# Patient Record
Sex: Female | Born: 1945 | Race: White | Hispanic: No | Smoking: Never smoker
Health system: Southern US, Community
[De-identification: ages and names within clinical notes are randomized; demographics above are authoritative.]

## PROBLEM LIST (undated history)

## (undated) DIAGNOSIS — R519 Headache, unspecified: Secondary | ICD-10-CM

## (undated) DIAGNOSIS — I6529 Occlusion and stenosis of unspecified carotid artery: Secondary | ICD-10-CM

## (undated) DIAGNOSIS — I82409 Acute embolism and thrombosis of unspecified deep veins of unspecified lower extremity: Secondary | ICD-10-CM

## (undated) DIAGNOSIS — M5030 Other cervical disc degeneration, unspecified cervical region: Secondary | ICD-10-CM

## (undated) DIAGNOSIS — N2889 Other specified disorders of kidney and ureter: Secondary | ICD-10-CM

## (undated) DIAGNOSIS — I10 Essential (primary) hypertension: Secondary | ICD-10-CM

## (undated) DIAGNOSIS — R569 Unspecified convulsions: Secondary | ICD-10-CM

## (undated) DIAGNOSIS — R011 Cardiac murmur, unspecified: Secondary | ICD-10-CM

## (undated) DIAGNOSIS — I2699 Other pulmonary embolism without acute cor pulmonale: Secondary | ICD-10-CM

## (undated) DIAGNOSIS — D649 Anemia, unspecified: Secondary | ICD-10-CM

## (undated) DIAGNOSIS — K219 Gastro-esophageal reflux disease without esophagitis: Secondary | ICD-10-CM

## (undated) DIAGNOSIS — I2510 Atherosclerotic heart disease of native coronary artery without angina pectoris: Secondary | ICD-10-CM

## (undated) HISTORY — PX: TUBAL LIGATION: SHX77

## (undated) HISTORY — PX: TONSILLECTOMY: SUR1361

## (undated) HISTORY — PX: SHOULDER SURGERY: SHX246

## (undated) HISTORY — PX: ABDOMINAL HYSTERECTOMY: SHX81

---

## 2004-09-07 ENCOUNTER — Emergency Department: Payer: Self-pay | Admitting: Emergency Medicine

## 2005-12-23 ENCOUNTER — Ambulatory Visit: Payer: Self-pay | Admitting: Psychiatry

## 2008-02-16 ENCOUNTER — Ambulatory Visit: Payer: Self-pay | Admitting: Internal Medicine

## 2008-03-28 ENCOUNTER — Ambulatory Visit: Payer: Self-pay | Admitting: Gastroenterology

## 2009-02-15 ENCOUNTER — Ambulatory Visit: Payer: Self-pay | Admitting: Internal Medicine

## 2009-11-13 ENCOUNTER — Ambulatory Visit: Payer: Self-pay | Admitting: Internal Medicine

## 2010-03-27 ENCOUNTER — Ambulatory Visit: Payer: Self-pay | Admitting: Internal Medicine

## 2010-04-17 ENCOUNTER — Ambulatory Visit: Payer: Self-pay | Admitting: Internal Medicine

## 2010-04-17 ENCOUNTER — Inpatient Hospital Stay: Payer: Self-pay | Admitting: Internal Medicine

## 2010-11-20 ENCOUNTER — Ambulatory Visit: Payer: Self-pay | Admitting: Internal Medicine

## 2011-11-21 ENCOUNTER — Ambulatory Visit: Payer: Self-pay | Admitting: Internal Medicine

## 2011-12-02 ENCOUNTER — Ambulatory Visit: Payer: Self-pay | Admitting: Internal Medicine

## 2011-12-11 ENCOUNTER — Ambulatory Visit: Payer: Self-pay | Admitting: Unknown Physician Specialty

## 2012-05-18 ENCOUNTER — Ambulatory Visit: Payer: Self-pay | Admitting: Internal Medicine

## 2012-09-16 ENCOUNTER — Ambulatory Visit: Payer: Self-pay | Admitting: Neurology

## 2012-09-16 LAB — CREATININE, SERUM
Creatinine: 1.02 mg/dL (ref 0.60–1.30)
EGFR (African American): >60
EGFR (Non-African Amer.): 56 — ABNORMAL LOW

## 2012-12-02 ENCOUNTER — Ambulatory Visit: Payer: Self-pay | Admitting: Internal Medicine

## 2013-06-15 ENCOUNTER — Ambulatory Visit: Payer: Self-pay | Admitting: Obstetrics and Gynecology

## 2013-08-15 DIAGNOSIS — I340 Nonrheumatic mitral (valve) insufficiency: Secondary | ICD-10-CM | POA: Insufficient documentation

## 2013-09-30 ENCOUNTER — Ambulatory Visit: Payer: Self-pay | Admitting: Internal Medicine

## 2013-12-07 DIAGNOSIS — Z7901 Long term (current) use of anticoagulants: Secondary | ICD-10-CM | POA: Insufficient documentation

## 2014-02-08 DIAGNOSIS — I2510 Atherosclerotic heart disease of native coronary artery without angina pectoris: Secondary | ICD-10-CM | POA: Insufficient documentation

## 2014-03-12 ENCOUNTER — Observation Stay: Payer: Self-pay | Admitting: Internal Medicine

## 2014-03-12 LAB — COMPREHENSIVE METABOLIC PANEL
ALT: 21 U/L
ANION GAP: 9 (ref 7–16)
Albumin: 3.2 g/dL — ABNORMAL LOW (ref 3.4–5.0)
Alkaline Phosphatase: 77 U/L
BUN: 13 mg/dL (ref 7–18)
Bilirubin,Total: 0.4 mg/dL (ref 0.2–1.0)
Calcium, Total: 8.6 mg/dL (ref 8.5–10.1)
Chloride: 110 mmol/L — ABNORMAL HIGH (ref 98–107)
Co2: 22 mmol/L (ref 21–32)
Creatinine: 0.78 mg/dL (ref 0.60–1.30)
EGFR (African American): >60
Glucose: 105 mg/dL — ABNORMAL HIGH (ref 65–99)
Osmolality: 282 (ref 275–301)
Potassium: 3.9 mmol/L (ref 3.5–5.1)
SGOT(AST): 19 U/L (ref 15–37)
Sodium: 141 mmol/L (ref 136–145)
TOTAL PROTEIN: 6.6 g/dL (ref 6.4–8.2)

## 2014-03-12 LAB — CBC
HCT: 38.4 % (ref 35.0–47.0)
HGB: 13.1 g/dL (ref 12.0–16.0)
MCH: 31.9 pg (ref 26.0–34.0)
MCHC: 34.1 g/dL (ref 32.0–36.0)
MCV: 94 fL (ref 80–100)
PLATELETS: 340 10*3/uL (ref 150–440)
RBC: 4.11 10*6/uL (ref 3.80–5.20)
RDW: 13.2 % (ref 11.5–14.5)
WBC: 7.2 10*3/uL (ref 3.6–11.0)

## 2014-03-12 LAB — URINALYSIS, COMPLETE
Bilirubin,UR: NEGATIVE
Blood: NEGATIVE
GLUCOSE, UR: NEGATIVE mg/dL (ref 0–75)
Ketone: NEGATIVE
LEUKOCYTE ESTERASE: NEGATIVE
NITRITE: NEGATIVE
Ph: 7 (ref 4.5–8.0)
Protein: NEGATIVE
RBC, UR: NONE SEEN /HPF (ref 0–5)
Specific Gravity: 1.004 (ref 1.003–1.030)
Squamous Epithelial: NONE SEEN

## 2014-03-12 LAB — CK TOTAL AND CKMB (NOT AT ARMC)
CK, TOTAL: 65 U/L (ref 26–192)
CK, Total: 68 U/L (ref 26–192)
CK-MB: 2.1 ng/mL (ref 0.5–3.6)
CK-MB: 1.9 ng/mL (ref 0.5–3.6)

## 2014-03-12 LAB — TROPONIN I
TROPONIN-I: 0.06 ng/mL — AB
Troponin-I: 0.06 ng/mL — ABNORMAL HIGH
Troponin-I: 0.06 ng/mL — ABNORMAL HIGH

## 2014-03-12 LAB — PROTIME-INR
INR: 1.1
PROTHROMBIN TIME: 13.7 s (ref 11.5–14.7)

## 2014-03-13 LAB — CBC WITH DIFFERENTIAL/PLATELET
BASOS ABS: 0.1 10*3/uL (ref 0.0–0.1)
Basophil %: 1.4 %
Eosinophil #: 0.5 10*3/uL (ref 0.0–0.7)
Eosinophil %: 5.5 %
HCT: 43.4 % (ref 35.0–47.0)
HGB: 14.2 g/dL (ref 12.0–16.0)
LYMPHS ABS: 4.5 10*3/uL — AB (ref 1.0–3.6)
Lymphocyte %: 49.6 %
MCH: 30.9 pg (ref 26.0–34.0)
MCHC: 32.7 g/dL (ref 32.0–36.0)
MCV: 94 fL (ref 80–100)
MONO ABS: 0.6 x10 3/mm (ref 0.2–0.9)
Monocyte %: 7.1 %
Neutrophil #: 3.3 10*3/uL (ref 1.4–6.5)
Neutrophil %: 36.4 %
Platelet: 361 10*3/uL (ref 150–440)
RBC: 4.6 10*6/uL (ref 3.80–5.20)
RDW: 13.3 % (ref 11.5–14.5)
WBC: 9.1 10*3/uL (ref 3.6–11.0)

## 2014-03-13 LAB — BASIC METABOLIC PANEL
Anion Gap: 10 (ref 7–16)
BUN: 9 mg/dL (ref 7–18)
CALCIUM: 9 mg/dL (ref 8.5–10.1)
Chloride: 109 mmol/L — ABNORMAL HIGH (ref 98–107)
Co2: 21 mmol/L (ref 21–32)
Creatinine: 0.91 mg/dL (ref 0.60–1.30)
Glucose: 95 mg/dL (ref 65–99)
Osmolality: 278 (ref 275–301)
Potassium: 3.6 mmol/L (ref 3.5–5.1)
Sodium: 140 mmol/L (ref 136–145)

## 2014-03-14 LAB — BASIC METABOLIC PANEL
Anion Gap: 7 (ref 7–16)
BUN: 11 mg/dL (ref 7–18)
CALCIUM: 7.9 mg/dL — AB (ref 8.5–10.1)
CHLORIDE: 112 mmol/L — AB (ref 98–107)
CO2: 21 mmol/L (ref 21–32)
CREATININE: 0.85 mg/dL (ref 0.60–1.30)
EGFR (Non-African Amer.): >60
Glucose: 96 mg/dL (ref 65–99)
Osmolality: 279 (ref 275–301)
POTASSIUM: 3.7 mmol/L (ref 3.5–5.1)
SODIUM: 140 mmol/L (ref 136–145)

## 2014-03-14 LAB — CBC WITH DIFFERENTIAL/PLATELET
BASOS ABS: 0 10*3/uL (ref 0.0–0.1)
Basophil %: 0.2 %
EOS ABS: 0.5 10*3/uL (ref 0.0–0.7)
Eosinophil %: 6 %
HCT: 38.5 % (ref 35.0–47.0)
HGB: 12.6 g/dL (ref 12.0–16.0)
LYMPHS PCT: 14.9 %
Lymphocyte #: 1.2 10*3/uL (ref 1.0–3.6)
MCH: 31 pg (ref 26.0–34.0)
MCHC: 32.6 g/dL (ref 32.0–36.0)
MCV: 95 fL (ref 80–100)
MONO ABS: 0.4 x10 3/mm (ref 0.2–0.9)
Monocyte %: 5.2 %
Neutrophil #: 6.1 10*3/uL (ref 1.4–6.5)
Neutrophil %: 73.7 %
Platelet: 296 10*3/uL (ref 150–440)
RBC: 4.06 10*6/uL (ref 3.80–5.20)
RDW: 12.9 % (ref 11.5–14.5)
WBC: 8.3 10*3/uL (ref 3.6–11.0)

## 2014-05-04 ENCOUNTER — Ambulatory Visit: Payer: Self-pay | Admitting: Internal Medicine

## 2014-07-02 NOTE — H&P (Signed)
PATIENT NAME:  Julia Klein, Julia Klein MR#:  [id number] DATE OF BIRTH:  [birth year]  DATE OF ADMISSION:  03/12/2014  REFERRING PHYSICIAN: Latina Craver, MD   FAMILY PHYSICIAN: Leonie Douglas. Shynice Sigel, MD   REASON FOR ADMISSION: Chest pain, worrisome for unstable angina.   HISTORY OF PRESENT ILLNESS: The patient is a 69 year old female with a history of bilateral PEs, on Xarelto. Also, has a history of seizures, degenerative disk disease. Presents to the Emergency Room with chest pain described as heaviness which occurred while walking her dog this morning. It was associated with nausea, diaphoresis and tachycardic palpitations. No shortness of breath. She presented to the Emergency Room where her EKG was nonacute, but her troponin was mildly elevated at 0.06. She is currently chest pain free. She is now admitted for further evaluation. Of note, the patient is followed by Dr. Serafina Royals of cardiology and had a stress test done approximately 6 months ago which was unremarkable.   PAST MEDICAL HISTORY:  1.  History of bilateral PEs, on Xarelto.  2.  Seizure disorder.  3.  Recurrent bronchitis.  4.  Cervical degenerative disk disease.   MEDICATIONS:  1.  Xarelto 20 mg p.o. daily.  2.  Topamax 50 mg p.o. at bedtime.   ALLERGIES: No known drug allergies.   SOCIAL HISTORY: The patient is a former smoker, but none over the past 5 to 7 years. Denies alcohol abuse.   FAMILY HISTORY: Positive for coronary artery disease, hypertension and diabetes.   REVIEW OF SYSTEMS: CONSTITUTIONAL: No fever or change in weight.  EYES: No blurred or double vision. No glaucoma.  EARS, NOSE AND THROAT: No tinnitus or hearing loss. No nasal discharge or bleeding. No difficulty swallowing.  RESPIRATORY: No cough or wheezing. Denies hemoptysis. No painful respiration.  CARDIOVASCULAR: No orthopnea or syncope.  GASTROINTESTINAL: No vomiting or diarrhea. No abdominal pain. No change in bowel habits.  GENITOURINARY: No  dysuria or hematuria. No incontinence.  ENDOCRINE: No polyuria or polydipsia. No heat or cold intolerance.  HEMATOLOGIC: The patient denies anemia, easy bruising or bleeding.  LYMPHATIC: No swollen glands.  MUSCULOSKELETAL: The patient denies pain in her back, shoulders, knees or hips. No gout.  NEUROLOGIC: No numbness or migraines. Denies stroke.  PSYCHIATRIC: The patient denies anxiety, insomnia or depression.   PHYSICAL EXAMINATION:  GENERAL: The patient is in no acute distress.  VITAL SIGNS: Currently remarkable for a blood pressure of 103/62 with a heart rate of 71, respiratory rate of 18, temperature of 98.3, saturation 99% on room air.  HEENT: Normocephalic, atraumatic. Pupils equally round and reactive to light and accommodation. Extraocular movements are intact. Sclerae are nonicteric. Conjunctivae are clear. Oropharynx is clear. NECK: Supple without JVD. No adenopathy or thyromegaly is noted.  LUNGS: Clear to auscultation and percussion without wheezes, rales or rhonchi. No dullness. Respiratory effort is normal.  CARDIAC: Regular rate and rhythm with a normal S1, S2. No significant rubs, murmurs or gallops. PMI is nondisplaced. Chest wall is nontender.  ABDOMEN: Soft, nontender with normoactive bowel sounds. No organomegaly or masses were appreciated. No hernias or bruits were noted.  EXTREMITIES: Without clubbing, cyanosis or edema. Pulses were 2+ bilaterally.  SKIN: Warm and dry without rash or lesions.  NEUROLOGIC: Revealed cranial nerves II-XII grossly intact. Deep tendon reflexes were symmetric. Motor and sensory examination is nonfocal.  PSYCHIATRIC: Revealed a patient who was alert and oriented to person, place and time. She was cooperative and used good judgment.   LABORATORY DATA: EKG revealed sinus  rhythm with no acute ischemic changes. Her chest x-ray was unremarkable. Her troponin was 0.06. CBC was within normal limits. Glucose was 105 with a BUN of 13 and a creatinine of  0.78 with a GFR of greater than 60.   ASSESSMENT:  1.  Chest pain, worrisome for unstable angina.  2.  Tachycardic palpitations.  3.  Elevated troponin.  4.  History of bilateral pulmonary embolisms.  5.  Seizure disorder.   PLAN: The patient will be observed on telemetry with aspirin and Lovenox. We will hold her Xarelto for now in case cardiology decides on cardiac catheterization. We will follow serial cardiac enzymes and obtain an urgent cardiology consult. Continue her Topamax for now. Follow up routine laboratories in the morning. Further treatment and evaluation will depend upon the patient's progress.   TOTAL TIME SPENT ON THIS PATIENT: Forty-five minutes.    ____________________________ Leonie Douglas Doy Hutching, MD jds:TT D: 03/12/2014 14:20:43 ET T: 03/12/2014 14:44:22 ET JOB#: [id number]  cc: Leonie Douglas. Doy Hutching, MD, <Dictator> [facility] Chaniyah Jahr D Mixtli Reno MD ELECTRONICALLY SIGNED 03/12/2014 18:03

## 2014-07-02 NOTE — Consult Note (Signed)
PATIENT NAME:  Julia Klein, Julia Klein MR#:  [id number] DATE OF BIRTH:  01-01-1946  DATE OF CONSULTATION:  03/12/2014  CONSULTING PHYSICIAN:  Isaias Cowman, MD  PRIMARY CARE PHYSICIAN:  Leonie Douglas. Doy Hutching, MD  CHIEF COMPLAINT: Chest pain.   REASON FOR CONSULTATION: Requested for evaluation of chest pain and borderline elevated troponin.   HISTORY OF PRESENT ILLNESS: The patient is a 69 year old female with history of bilateral pulmonary emboli, on chronic Xarelto therapy. The patient has been in her usual state of health until day of admission, when she experienced exertional chest pain. The patient was apparently walking her dog when she experienced substernal chest heaviness without radiation or  shortness of breath. The patient did experience nausea, diaphoresis, and palpitations. In the Emergency Room, EKG was nondiagnostic. Initial troponin was borderline elevated to 0.06. The patient did not take her Xarelto today.   PAST MEDICAL HISTORY: 1.  Bilateral pulmonary emboli.  2.  Seizure disorder.  3.  Recurrent bronchitis.   MEDICATIONS: Xarelto 20 mg daily, Topamax 50 mg at bedtime.  ALLERGIES:  No known drug allergies.   SOCIAL HISTORY: The patient is married, resides with her husband. She quit tobacco abuse 16 years ago.   FAMILY HISTORY: Father and brother are status post MI.   REVIEW OF SYSTEMS:  CONSTITUTIONAL: No fever or chills.  EYES: No blurry vision.  EARS: No hearing loss.  RESPIRATORY: No shortness of breath.  CARDIOVASCULAR: Chest pain as described above.  GASTROINTESTINAL: The patient had nausea. GENITOURINARY: No dysuria or hematuria.  ENDOCRINE: No polyuria or polydipsia.  MUSCULOSKELETAL: No arthralgias or myalgias.  NEUROLOGICAL: No focal muscle weakness or numbness.  PSYCHOLOGICAL: No depression or anxiety.   PHYSICAL EXAMINATION: VITAL SIGNS: Blood pressure 136/73, pulse 71, respirations 18, temperature 97.5, pulse oximetry 98%.  HEENT: Pupils equal,  reactive to light and accommodation.  NECK: Supple without thyromegaly.  LUNGS: Clear.  CARDIOVASCULAR: Normal JVP. Normal PMI. Regular rate and rhythm. Normal S1, S2. No appreciable gallop, murmur, or rub.  ABDOMEN: Soft and nontender. Pulses were intact bilaterally.  MUSCULOSKELETAL: Normal muscle tone.  NEUROLOGIC: The patient is alert and oriented x 3. Motor and sensory both grossly intact.   IMPRESSION: A 69 year old female with history of pulmonary embolus who presents with exertional chest pain with borderline elevated troponin; EKG is nondiagnostic. The patient has not taken her Xarelto today.   RECOMMENDATIONS: 1.  Agree with overall current therapy.  2.  Hold Xarelto. 3.  Lovenox 1 mg subcutaneous q. 12.  4.  Proceed with cardiac catheterization with selective coronary arteriography in the a.m. The risks, benefits, and alternatives were explained and informed written consent obtained.    ____________________________ Isaias Cowman, MD ap:LT D: 03/12/2014 16:30:39 ET T: 03/12/2014 17:33:33 ET JOB#: [id number]  cc: Isaias Cowman, MD, <Dictator> Isaias Cowman MD ELECTRONICALLY SIGNED 03/22/2014 18:06

## 2015-05-09 ENCOUNTER — Ambulatory Visit
Admission: RE | Admit: 2015-05-09 | Discharge: 2015-05-09 | Disposition: A | Discharge status: Home / Self Care | Payer: Medicare Other | Source: Ambulatory Visit | Attending: Physician Assistant | Admitting: Physician Assistant

## 2015-05-09 ENCOUNTER — Other Ambulatory Visit: Payer: Self-pay | Admitting: Physician Assistant

## 2015-05-09 DIAGNOSIS — M7989 Other specified soft tissue disorders: Secondary | ICD-10-CM | POA: Insufficient documentation

## 2015-07-11 ENCOUNTER — Other Ambulatory Visit: Payer: Self-pay | Admitting: Physician Assistant

## 2015-07-11 DIAGNOSIS — R110 Nausea: Secondary | ICD-10-CM

## 2015-07-11 DIAGNOSIS — R5381 Other malaise: Secondary | ICD-10-CM

## 2015-07-11 DIAGNOSIS — R1011 Right upper quadrant pain: Secondary | ICD-10-CM

## 2015-07-12 ENCOUNTER — Ambulatory Visit
Admission: RE | Admit: 2015-07-12 | Discharge: 2015-07-12 | Disposition: A | Discharge status: Home / Self Care | Payer: Medicare Other | Source: Ambulatory Visit | Attending: Physician Assistant | Admitting: Physician Assistant

## 2015-07-12 DIAGNOSIS — R110 Nausea: Secondary | ICD-10-CM | POA: Diagnosis not present

## 2015-07-12 DIAGNOSIS — R1011 Right upper quadrant pain: Secondary | ICD-10-CM | POA: Diagnosis not present

## 2015-07-12 DIAGNOSIS — R5381 Other malaise: Secondary | ICD-10-CM

## 2015-12-10 DIAGNOSIS — E669 Obesity, unspecified: Secondary | ICD-10-CM | POA: Insufficient documentation

## 2016-04-21 ENCOUNTER — Other Ambulatory Visit: Payer: Self-pay | Admitting: Internal Medicine

## 2016-04-21 DIAGNOSIS — R1084 Generalized abdominal pain: Secondary | ICD-10-CM

## 2016-04-28 ENCOUNTER — Ambulatory Visit
Admission: RE | Admit: 2016-04-28 | Discharge: 2016-04-28 | Disposition: A | Discharge status: Home / Self Care | Payer: Medicare Other | Source: Ambulatory Visit | Attending: Internal Medicine | Admitting: Internal Medicine

## 2016-04-28 DIAGNOSIS — K769 Liver disease, unspecified: Secondary | ICD-10-CM | POA: Diagnosis not present

## 2016-04-28 DIAGNOSIS — R1084 Generalized abdominal pain: Secondary | ICD-10-CM | POA: Insufficient documentation

## 2016-07-14 ENCOUNTER — Other Ambulatory Visit: Payer: Self-pay | Admitting: Nurse Practitioner

## 2016-07-14 DIAGNOSIS — R1011 Right upper quadrant pain: Secondary | ICD-10-CM

## 2016-07-14 DIAGNOSIS — K219 Gastro-esophageal reflux disease without esophagitis: Secondary | ICD-10-CM | POA: Insufficient documentation

## 2016-07-23 ENCOUNTER — Other Ambulatory Visit: Payer: Self-pay | Admitting: Nurse Practitioner

## 2016-07-23 DIAGNOSIS — K219 Gastro-esophageal reflux disease without esophagitis: Secondary | ICD-10-CM

## 2016-07-24 ENCOUNTER — Encounter
Admission: RE | Admit: 2016-07-24 | Discharge: 2016-07-24 | Disposition: A | Discharge status: Home / Self Care | Payer: Medicare Other | Source: Ambulatory Visit | Attending: Nurse Practitioner | Admitting: Nurse Practitioner

## 2016-07-24 DIAGNOSIS — R1011 Right upper quadrant pain: Secondary | ICD-10-CM | POA: Diagnosis present

## 2016-07-24 MED ORDER — TECHNETIUM TC 99M MEBROFENIN IV KIT
4.9700 | PACK | Freq: Once | INTRAVENOUS | Status: AC | PRN
  Administered 2016-07-24: 4.97 via INTRAVENOUS

## 2016-08-01 ENCOUNTER — Ambulatory Visit
Admission: RE | Admit: 2016-08-01 | Discharge: 2016-08-01 | Disposition: A | Discharge status: Home / Self Care | Payer: Medicare Other | Source: Ambulatory Visit | Attending: Nurse Practitioner | Admitting: Nurse Practitioner

## 2016-08-01 ENCOUNTER — Other Ambulatory Visit: Payer: Medicare Other

## 2016-09-02 ENCOUNTER — Other Ambulatory Visit: Payer: Self-pay | Admitting: Internal Medicine

## 2016-09-02 DIAGNOSIS — Z1231 Encounter for screening mammogram for malignant neoplasm of breast: Secondary | ICD-10-CM

## 2016-09-12 ENCOUNTER — Ambulatory Visit
Admission: RE | Admit: 2016-09-12 | Discharge: 2016-09-12 | Disposition: A | Discharge status: Home / Self Care | Payer: Medicare Other | Source: Ambulatory Visit | Attending: Internal Medicine | Admitting: Internal Medicine

## 2016-09-12 ENCOUNTER — Encounter (HOSPITAL_COMMUNITY): Payer: Self-pay

## 2016-09-12 DIAGNOSIS — Z1231 Encounter for screening mammogram for malignant neoplasm of breast: Secondary | ICD-10-CM | POA: Diagnosis present

## 2016-10-20 ENCOUNTER — Other Ambulatory Visit: Payer: Self-pay | Admitting: Nurse Practitioner

## 2016-10-20 DIAGNOSIS — R1011 Right upper quadrant pain: Secondary | ICD-10-CM

## 2016-10-31 ENCOUNTER — Ambulatory Visit: Admit: 2016-10-31 | Payer: Medicare Other | Admitting: Unknown Physician Specialty

## 2016-10-31 SURGERY — COLONOSCOPY WITH PROPOFOL
Anesthesia: General

## 2016-11-04 ENCOUNTER — Ambulatory Visit: Admission: RE | Admit: 2016-11-04 | Payer: Medicare Other | Source: Ambulatory Visit

## 2016-11-04 ENCOUNTER — Ambulatory Visit
Admission: RE | Admit: 2016-11-04 | Discharge: 2016-11-04 | Disposition: A | Discharge status: Home / Self Care | Payer: Medicare Other | Source: Ambulatory Visit | Attending: Nurse Practitioner | Admitting: Nurse Practitioner

## 2016-11-04 DIAGNOSIS — K219 Gastro-esophageal reflux disease without esophagitis: Secondary | ICD-10-CM

## 2016-11-04 DIAGNOSIS — K449 Diaphragmatic hernia without obstruction or gangrene: Secondary | ICD-10-CM | POA: Insufficient documentation

## 2016-11-06 ENCOUNTER — Ambulatory Visit
Admission: RE | Admit: 2016-11-06 | Discharge: 2016-11-06 | Disposition: A | Discharge status: Home / Self Care | Payer: Medicare Other | Source: Ambulatory Visit | Attending: Nurse Practitioner | Admitting: Nurse Practitioner

## 2016-11-06 DIAGNOSIS — R1011 Right upper quadrant pain: Secondary | ICD-10-CM | POA: Diagnosis present

## 2016-11-06 DIAGNOSIS — D1771 Benign lipomatous neoplasm of kidney: Secondary | ICD-10-CM | POA: Insufficient documentation

## 2016-11-06 MED ORDER — GADOBENATE DIMEGLUMINE 529 MG/ML IV SOLN
20.0000 mL | Freq: Once | INTRAVENOUS | Status: AC | PRN
  Administered 2016-11-06: 20 mL via INTRAVENOUS

## 2016-11-08 LAB — POCT I-STAT CREATININE: Creatinine, Ser: 0.8 mg/dL (ref 0.44–1.00)

## 2016-12-01 ENCOUNTER — Other Ambulatory Visit: Payer: Self-pay | Admitting: Internal Medicine

## 2016-12-01 DIAGNOSIS — M7989 Other specified soft tissue disorders: Secondary | ICD-10-CM

## 2016-12-08 ENCOUNTER — Ambulatory Visit: Payer: Medicare Other

## 2017-03-06 ENCOUNTER — Other Ambulatory Visit: Payer: Self-pay

## 2017-03-06 ENCOUNTER — Emergency Department: Payer: Medicare Other

## 2017-03-06 ENCOUNTER — Emergency Department
Admission: EM | Admit: 2017-03-06 | Discharge: 2017-03-06 | Disposition: A | Discharge status: Home / Self Care | Payer: Medicare Other | Attending: Student in an Organized Health Care Education/Training Program | Admitting: Student in an Organized Health Care Education/Training Program

## 2017-03-06 ENCOUNTER — Encounter: Payer: Self-pay | Admitting: Emergency Medicine

## 2017-03-06 DIAGNOSIS — Y9289 Other specified places as the place of occurrence of the external cause: Secondary | ICD-10-CM | POA: Insufficient documentation

## 2017-03-06 DIAGNOSIS — W19XXXA Unspecified fall, initial encounter: Secondary | ICD-10-CM

## 2017-03-06 DIAGNOSIS — W01198A Fall on same level from slipping, tripping and stumbling with subsequent striking against other object, initial encounter: Secondary | ICD-10-CM | POA: Diagnosis not present

## 2017-03-06 DIAGNOSIS — S20229A Contusion of unspecified back wall of thorax, initial encounter: Secondary | ICD-10-CM | POA: Insufficient documentation

## 2017-03-06 DIAGNOSIS — G44319 Acute post-traumatic headache, not intractable: Secondary | ICD-10-CM | POA: Diagnosis not present

## 2017-03-06 DIAGNOSIS — Y9389 Activity, other specified: Secondary | ICD-10-CM | POA: Diagnosis not present

## 2017-03-06 DIAGNOSIS — G40A09 Absence epileptic syndrome, not intractable, without status epilepticus: Secondary | ICD-10-CM | POA: Insufficient documentation

## 2017-03-06 DIAGNOSIS — S3992XA Unspecified injury of lower back, initial encounter: Secondary | ICD-10-CM | POA: Diagnosis present

## 2017-03-06 DIAGNOSIS — M62830 Muscle spasm of back: Secondary | ICD-10-CM | POA: Diagnosis not present

## 2017-03-06 DIAGNOSIS — Y998 Other external cause status: Secondary | ICD-10-CM | POA: Diagnosis not present

## 2017-03-06 HISTORY — DX: Other pulmonary embolism without acute cor pulmonale: I26.99

## 2017-03-06 HISTORY — DX: Unspecified convulsions: R56.9

## 2017-03-06 MED ORDER — METHOCARBAMOL 500 MG PO TABS: 500.0000 mg | ORAL_TABLET | Freq: Four times a day (QID) | ORAL | 0 refills | Status: DC

## 2017-03-06 MED ORDER — HYDROCODONE-ACETAMINOPHEN 5-325 MG PO TABS: 1.0000 | ORAL_TABLET | ORAL | 0 refills | Status: DC | PRN

## 2017-03-06 MED ORDER — ACETAMINOPHEN 325 MG PO TABS
650.0000 mg | ORAL_TABLET | Freq: Once | ORAL | Status: AC
  Administered 2017-03-06: 650 mg via ORAL
  Filled 2017-03-06: qty 2

## 2017-03-06 NOTE — ED Provider Notes (Signed)
[facility] Emergency Department Provider Note  ____________________________________________  Time seen: Approximately 5:25 PM  I have reviewed the triage vital signs and the nursing notes.   HISTORY  Chief Complaint Fall    HPI Julia Klein is a 72 y.o. female who presents emergency department status post fall today.  Patient reports that she was in an auto dealership picking up her car when she stepped on a patch of what well.  Patient reports that her "feet flew out from underneath her" and she landed on her "tailbone/small of the back" region.  Patient reports that initially she had some mild pain but was able to gather herself, walk around with no difficulty.  Patient reports that on the way home she started to develop mid and lower back pain.  After arriving at home, she noticed that her neck was hurting and she developed a headache.  Patient did not hit her head or lose consciousness during the fall.  She denies any lacerations or abrasions.  Patient does not take any medication for this complaint prior to arrival.  Patient has a history of absence seizure.  Patient reports that while in the emergency department, she felt a "seizure coming on."  Patient reports that these typically last for only a period of seconds.  Patient reports that she has had only 1 of these today.  Last seizure activity was 2-3 months ago.  Patient takes Topamax daily for this condition.  Patient denies any change from baseline absence seizure.  Past Medical History:  Diagnosis Date  . Pulmonary embolism ([place])   . Seizures ([place])     There are no active problems to display for this patient.   Past Surgical History:  Procedure Laterality Date  . ABDOMINAL HYSTERECTOMY    . SHOULDER SURGERY Left     Prior to Admission medications   Medication Sig Start Date End Date Taking? Authorizing Provider  HYDROcodone-acetaminophen (NORCO/VICODIN) 5-325 MG tablet Take 1 tablet by mouth  every 4 (four) hours as needed for moderate pain. 03/06/17   Caia Lofaro, Charline Bills, PA-C  methocarbamol (ROBAXIN) 500 MG tablet Take 1 tablet (500 mg total) by mouth 4 (four) times daily. 03/06/17   Masson Nalepa, Charline Bills, PA-C    Allergies Ivp dye [iodinated diagnostic agents]  Family History  Problem Relation Age of Onset  . Breast cancer Paternal Aunt     Social History Social History   Tobacco Use  . Smoking status: Never Smoker  . Smokeless tobacco: Never Used  Substance Use Topics  . Alcohol use: No    Frequency: Never  . Drug use: No     Review of Systems  Constitutional: No fever/chills Eyes: No visual changes.  Cardiovascular: no chest pain. Respiratory: no cough. No SOB. Gastrointestinal: No abdominal pain.  No nausea, no vomiting.  Musculoskeletal: Positive for neck, mid and lower back pain. Skin: Negative for rash, abrasions, lacerations, ecchymosis. Neurological: Acid for headache but denies focal weakness or numbness.  Positive for absence seizure. 10-point ROS otherwise negative.  ____________________________________________   PHYSICAL EXAM:  VITAL SIGNS: ED Triage Vitals  Enc Vitals Group     BP 03/06/17 1656 (!) 148/76     Pulse Rate 03/06/17 1656 81     Resp 03/06/17 1656 14     Temp 03/06/17 1656 98.3 F (36.8 C)     Temp Source 03/06/17 1656 Oral     SpO2 03/06/17 1656 100 %     Weight 03/06/17 1656 220 lb (99.8  kg)     Height 03/06/17 1656 5\' 7"  (1.702 m)     Head Circumference --      Peak Flow --      Pain Score 03/06/17 1700 5     Pain Loc --      Pain Edu? --      Excl. in [place]? --      Constitutional: Alert and oriented. Well appearing and in no acute distress. Eyes: Conjunctivae are normal. PERRL. EOMI. Head: Atraumatic. ENT:      Ears:       Nose: No congestion/rhinnorhea.      Mouth/Throat: Mucous membranes are moist.  Neck: No stridor.  Diffuse cervical spine tenderness to palpation both midline and over the paraspinal muscle  groups.  No palpable abnormality or step-off.  Radial pulse intact bilateral upper extremities.  Sensation intact and equal bilateral upper extremities.  Cardiovascular: Normal rate, regular rhythm. Normal S1 and S2.  Good peripheral circulation. Respiratory: Normal respiratory effort without tachypnea or retractions. Lungs CTAB. Good air entry to the bases with no decreased or absent breath sounds. Musculoskeletal: Full range of motion to all extremities. No gross deformities appreciated.  No visible deformity or injury to the second lumbar spine.  No ecchymosis, abrasions, lacerations.  Patient is diffusely tender to palpation in the upper thoracic and mid to lower lumbar spine.  No palpable abnormality or step-off.  Diffuse tenderness to palpation over bilateral paraspinal muscle groups in both regions.  No tenderness to palpation over bilateral sciatic notches.  Negative straight leg raise bilaterally.  Dorsalis pedis pulse intact bilateral lower extremities.  Sensation intact and equal bilateral lower extremities. Neurologic:  Normal speech and language. No gross focal neurologic deficits are appreciated.  Cranial nerves II through XII grossly intact.  Negative Romberg's and pronator drift. Skin:  Skin is warm, dry and intact. No rash noted. Psychiatric: Mood and affect are normal. Speech and behavior are normal. Patient exhibits appropriate insight and judgement.   ____________________________________________   LABS (all labs ordered are listed, but only abnormal results are displayed)  Labs Reviewed - No data to display ____________________________________________  EKG   ____________________________________________  RADIOLOGY Diamantina Providence Jeanpierre Thebeau, personally viewed and evaluated these images as part of my medical decision making, as well as reviewing the written report by the radiologist.  Dg Thoracic Spine 2 View  Result Date: 03/06/2017 CLINICAL DATA:  Back pain after fall  today. EXAM: THORACIC SPINE 2 VIEWS COMPARISON:  Lateral chest radiograph from 04/23/2010 FINDINGS: Multilevel mild degenerative disc space narrowing and endplate spurring is re- demonstrated with mild anterior compression of T8, stable dating back to 2012. Alignment is normal. No other significant bone abnormalities are identified. IMPRESSION: No acute osseous abnormality of the thoracic spine. Thoracic spondylosis with mild anterior height loss of T8, stable dating back to 2012. Electronically Signed   By: Ashley Royalty M.D.   On: 03/06/2017 18:28   Dg Lumbar Spine Complete  Result Date: 03/06/2017 CLINICAL DATA:  Back pain after fall today. EXAM: LUMBAR SPINE - COMPLETE 4+ VIEW COMPARISON:  None. FINDINGS: Normal lumbar segmentation. Moderate disc space narrowing with endplate spurring is noted at L1-2. No pars defects or listhesis. Mild facet joint space narrowing and sclerosis from L4 through S1. Aortoiliac atherosclerosis without aneurysm. The included SI joints are unremarkable. IMPRESSION: 1. Moderate degenerative disc space narrowing with endplate spurring at O1-6. 2. L4 through S1 facet arthropathy. 3. No acute osseous abnormality of the lumbar spine. Electronically Signed  By: Ashley Royalty M.D.   On: 03/06/2017 18:30   Dg Pelvis 1-2 Views  Result Date: 03/06/2017 CLINICAL DATA:  Left hip pain after fall at car dealership today. EXAM: PELVIS - 1-2 VIEW COMPARISON:  None. FINDINGS: AP view the pelvis only which includes AP views of both hips. Lower lumbar facet arthropathy is seen. Both hip joints are maintained. No flattening of the femoral heads. No acute nor displaced appearing fracture of the bony pelvis and hips. No pelvic bone lesions are seen. IMPRESSION: Lower lumbar facet arthropathy. No apparent fracture of the bony pelvis and hips. Electronically Signed   By: Ashley Royalty M.D.   On: 03/06/2017 18:34   Ct Head Wo Contrast  Result Date: 03/06/2017 CLINICAL DATA:  [place] today, absence seizure  after getting to the emergency room, worsening neck pain since fall, history of absence seizures EXAM: CT HEAD WITHOUT CONTRAST CT CERVICAL SPINE WITHOUT CONTRAST TECHNIQUE: Multidetector CT imaging of the head and cervical spine was performed following the standard protocol without intravenous contrast. Multiplanar CT image reconstructions of the cervical spine were also generated. COMPARISON:  MR brain 09/16/2012, MR cervical spine 02/16/2008 FINDINGS: CT HEAD FINDINGS Brain: Generalized atrophy. Normal ventricular morphology. No midline shift or mass effect. Minimal small vessel chronic ischemic changes of deep cerebral white matter. No intracranial hemorrhage, mass lesion, or evidence of acute infarction. No extra-axial fluid collections. Vascular: Atherosclerotic calcification of internal carotid arteries bilaterally at skullbase Skull: Osseous demineralization.  Calvaria intact. Sinuses/Orbits: Visualized paranasal sinuses and mastoid air cells clear Other: N/A CT CERVICAL SPINE FINDINGS Alignment: Normal Skull base and vertebrae: Osseous demineralization. Visualized skullbase intact. Multilevel disc space narrowing. Multilevel facet degenerative changes. Vertebral body heights maintained without fracture or subluxation. Soft tissues and spinal canal: Prevertebral soft tissues normal thickness. Cervical soft tissues otherwise unremarkable. Disc levels: Scattered endplate spur formation. No gross disc herniation. Upper chest: Tips of lung apices clear. Other: Atherosclerotic calcifications at BILATERAL carotid bifurcations. IMPRESSION: Atrophy with minimal small vessel chronic ischemic changes of deep cerebral white matter. No acute intracranial abnormalities. Osseous demineralization with scattered degenerative disc and facet disease changes of the cervical spine. No acute cervical spine abnormalities. Carotid vascular calcifications bilaterally. Electronically Signed   By: Lavonia Dana M.D.   On: 03/06/2017  18:13   Ct Cervical Spine Wo Contrast  Result Date: 03/06/2017 CLINICAL DATA:  [place] today, absence seizure after getting to the emergency room, worsening neck pain since fall, history of absence seizures EXAM: CT HEAD WITHOUT CONTRAST CT CERVICAL SPINE WITHOUT CONTRAST TECHNIQUE: Multidetector CT imaging of the head and cervical spine was performed following the standard protocol without intravenous contrast. Multiplanar CT image reconstructions of the cervical spine were also generated. COMPARISON:  MR brain 09/16/2012, MR cervical spine 02/16/2008 FINDINGS: CT HEAD FINDINGS Brain: Generalized atrophy. Normal ventricular morphology. No midline shift or mass effect. Minimal small vessel chronic ischemic changes of deep cerebral white matter. No intracranial hemorrhage, mass lesion, or evidence of acute infarction. No extra-axial fluid collections. Vascular: Atherosclerotic calcification of internal carotid arteries bilaterally at skullbase Skull: Osseous demineralization.  Calvaria intact. Sinuses/Orbits: Visualized paranasal sinuses and mastoid air cells clear Other: N/A CT CERVICAL SPINE FINDINGS Alignment: Normal Skull base and vertebrae: Osseous demineralization. Visualized skullbase intact. Multilevel disc space narrowing. Multilevel facet degenerative changes. Vertebral body heights maintained without fracture or subluxation. Soft tissues and spinal canal: Prevertebral soft tissues normal thickness. Cervical soft tissues otherwise unremarkable. Disc levels: Scattered endplate spur formation. No gross disc herniation. Upper chest:  Tips of lung apices clear. Other: Atherosclerotic calcifications at BILATERAL carotid bifurcations. IMPRESSION: Atrophy with minimal small vessel chronic ischemic changes of deep cerebral white matter. No acute intracranial abnormalities. Osseous demineralization with scattered degenerative disc and facet disease changes of the cervical spine. No acute cervical spine abnormalities.  Carotid vascular calcifications bilaterally. Electronically Signed   By: Lavonia Dana M.D.   On: 03/06/2017 18:13    ____________________________________________    PROCEDURES  Procedure(s) performed:    Procedures    Medications  acetaminophen (TYLENOL) tablet 650 mg (650 mg Oral Given 03/06/17 1820)     ____________________________________________   INITIAL IMPRESSION / ASSESSMENT AND PLAN / ED COURSE  Pertinent labs & imaging results that were available during my care of the patient were reviewed by me and considered in my medical decision making (see chart for details).  Review of the [place] CSRS was performed in accordance of the [place] prior to dispensing any controlled drugs.    ----------------------------------------- 6:41 PM on 03/06/2017 -----------------------------------------  Patient's imaging results returned with reassuring results with no osseous or intracranial abnormality.  Patient's exam is reassuring.  Patient does report absence seizure after fall.  Patient is not postictal at this time.  Neurologically intact.  Patient reports that she takes daily medications with good control.  Patient did not strike her head or lose consciousness.  At this time, negative head CT, patient's exam being reassuring, no indication for further workup.    Patient's diagnosis is consistent with fall resulting in back contusion, absence seizure, headache.  Patient's imaging returned with reassuring results.  Patient's exam is very reassuring.  Discussed at length with patient her absence seizure.  Patient reports that she has been under treatment as well as stress, and the increased pain, anxiety over possible diagnosis.  At this time, patient is at her baseline.  I feel comfortable sending the patient home at this time.  I spent considerable amount of time discussing follow-up with patient including signs and symptoms to be concerned over for the next several days.  Patient verbalizes  understanding and states that she will return if symptoms worsen, or she experiences further seizure-like activity.. Patient will be discharged home with prescriptions for a few pills of Vicodin and muscle relaxer. Patient is to follow up with primary care as needed or otherwise directed. Patient is given ED precautions to return to the ED for any worsening or new symptoms.     ____________________________________________  FINAL CLINICAL IMPRESSION(S) / ED DIAGNOSES  Final diagnoses:  Fall, initial encounter  Contusion of back, unspecified laterality, initial encounter  Muscle spasm of back  Acute post-traumatic headache, not intractable  Absence seizure ([place])      NEW MEDICATIONS STARTED DURING THIS VISIT:  ED Discharge Orders        Ordered    HYDROcodone-acetaminophen (NORCO/VICODIN) 5-325 MG tablet  Every 4 hours PRN     03/06/17 1857    methocarbamol (ROBAXIN) 500 MG tablet  4 times daily     03/06/17 1857          This chart was dictated using voice recognition software/Dragon. Despite best efforts to proofread, errors can occur which can change the meaning. Any change was purely unintentional.    Darletta Moll, PA-C 03/06/17 1858    Merlyn Lot, MD 03/06/17 1919

## 2017-03-06 NOTE — ED Triage Notes (Signed)
Pt to ED after fall tonight.  States was at [employer] and slipped on water and oil spot, denies hitting head or LOC.  States feet came out from under her and landed on her buttocks.  C/o headache and pain with palpation down spine.

## 2017-03-06 NOTE — ED Notes (Signed)
Pt slip and fall onto floor at car [employer]. Pt states she landed on her tailbone. Tenderness to palpation of back per PA exam witnessed. Pt alert and oriented X4, active, cooperative, pt in NAD. RR even and unlabored, color WNL.

## 2017-03-06 NOTE — ED Notes (Signed)
No protocols at this time per [place], [place].

## 2017-05-12 ENCOUNTER — Other Ambulatory Visit: Payer: Self-pay | Admitting: Internal Medicine

## 2017-05-12 DIAGNOSIS — M79604 Pain in right leg: Secondary | ICD-10-CM

## 2017-05-18 ENCOUNTER — Ambulatory Visit: Admission: RE | Admit: 2017-05-18 | Payer: Medicare Other | Source: Ambulatory Visit

## 2017-05-19 ENCOUNTER — Ambulatory Visit
Admission: RE | Admit: 2017-05-19 | Discharge: 2017-05-19 | Disposition: A | Discharge status: Home / Self Care | Payer: Medicare Other | Source: Ambulatory Visit | Attending: Internal Medicine | Admitting: Internal Medicine

## 2017-05-19 ENCOUNTER — Other Ambulatory Visit: Payer: Self-pay | Admitting: General Surgery

## 2017-05-19 DIAGNOSIS — N644 Mastodynia: Secondary | ICD-10-CM

## 2017-05-19 DIAGNOSIS — M79604 Pain in right leg: Secondary | ICD-10-CM | POA: Diagnosis present

## 2017-05-26 ENCOUNTER — Ambulatory Visit
Admission: RE | Admit: 2017-05-26 | Discharge: 2017-05-26 | Disposition: A | Discharge status: Home / Self Care | Payer: Medicare Other | Source: Ambulatory Visit | Attending: General Surgery | Admitting: General Surgery

## 2017-05-26 DIAGNOSIS — N644 Mastodynia: Secondary | ICD-10-CM

## 2017-10-14 DIAGNOSIS — T148XXA Other injury of unspecified body region, initial encounter: Secondary | ICD-10-CM | POA: Insufficient documentation

## 2017-11-06 ENCOUNTER — Other Ambulatory Visit: Payer: Self-pay | Admitting: Internal Medicine

## 2017-11-06 DIAGNOSIS — Z1231 Encounter for screening mammogram for malignant neoplasm of breast: Secondary | ICD-10-CM

## 2017-12-02 ENCOUNTER — Ambulatory Visit
Admission: RE | Admit: 2017-12-02 | Discharge: 2017-12-02 | Disposition: A | Discharge status: Home / Self Care | Payer: Medicare Other | Source: Ambulatory Visit | Attending: Internal Medicine | Admitting: Internal Medicine

## 2017-12-02 DIAGNOSIS — Z1231 Encounter for screening mammogram for malignant neoplasm of breast: Secondary | ICD-10-CM | POA: Diagnosis present

## 2018-03-05 DIAGNOSIS — D649 Anemia, unspecified: Secondary | ICD-10-CM | POA: Insufficient documentation

## 2018-03-05 NOTE — Progress Notes (Signed)
[place]  Telephone:(336) [phone] Fax:(336) [phone]  ID: Julia Klein OB: 09-08-1945  MR#: [id number]  KLK#:[id number]  Patient Care Team: Idelle Crouch, MD as PCP - General (Internal Medicine)  CHIEF COMPLAINT: Anemia, unspecified.  INTERVAL HISTORY: Patient is a 73 year old female who was noted to have a declining hemoglobin on routine blood work.  She admits to being tired and fatigued all the time, but otherwise feels well.  She has no neurologic complaints.  She denies any recent fevers or illnesses.  She has a good appetite and denies weight loss.  She has no chest pain or shortness of breath.  She denies any nausea, vomiting, constipation, or diarrhea.  She has no melena or hematochezia.  She has no urinary complaints.  Patient otherwise feels well and offers no further specific complaints today.  REVIEW OF SYSTEMS:   Review of Systems  Constitutional: Positive for malaise/fatigue. Negative for fever and weight loss.  Respiratory: Negative.  Negative for cough, hemoptysis and shortness of breath.   Cardiovascular: Negative.  Negative for chest pain and leg swelling.  Gastrointestinal: Negative.  Negative for abdominal pain, blood in stool and melena.  Genitourinary: Negative.  Negative for hematuria.  Musculoskeletal: Negative.  Negative for back pain.  Skin: Negative.  Negative for rash.  Neurological: Negative.  Negative for dizziness, focal weakness, weakness and headaches.  Psychiatric/Behavioral: Negative.  The patient is not nervous/anxious.     As per HPI. Otherwise, a complete review of systems is negative.  PAST MEDICAL HISTORY: Past Medical History:  Diagnosis Date  . Pulmonary embolism ([place])   . Seizures ([place])     PAST SURGICAL HISTORY: Past Surgical History:  Procedure Laterality Date  . ABDOMINAL HYSTERECTOMY    . SHOULDER SURGERY Left     FAMILY HISTORY: Family History  Problem Relation Age of Onset  . Breast cancer  Paternal Aunt     ADVANCED DIRECTIVES (Y/N):  N  HEALTH MAINTENANCE: Social History   Tobacco Use  . Smoking status: Never Smoker  . Smokeless tobacco: Never Used  Substance Use Topics  . Alcohol use: No    Frequency: Never  . Drug use: No     Colonoscopy:  PAP:  Bone density:  Lipid panel:  Allergies  Allergen Reactions  . Amoxicillin-Pot Clavulanate Diarrhea and Nausea And Vomiting  . Esomeprazole Magnesium Other (See Comments)  . Ivp Dye [Iodinated Diagnostic Agents] Hives  . Levofloxacin Rash and Swelling  . Omeprazole Other (See Comments) and Palpitations    Current Outpatient Medications  Medication Sig Dispense Refill  . rivaroxaban (XARELTO) 10 MG TABS tablet Take 1 tablet by mouth.    . topiramate (TOPAMAX) 50 MG tablet Take 1 tablet by mouth 1 day or 1 dose.    Marland Kitchen HYDROcodone-acetaminophen (NORCO/VICODIN) 5-325 MG tablet Take 1 tablet by mouth every 4 (four) hours as needed for moderate pain. 8 tablet 0   No current facility-administered medications for this visit.     OBJECTIVE: Vitals:   03/11/18 1358  BP: (!) 144/79  Pulse: 95  Temp: (!) 96.7 F (35.9 C)     Body mass index is 36.02 kg/m.    ECOG FS:0 - Asymptomatic  General: Well-developed, well-nourished, no acute distress. Eyes: Pink conjunctiva, anicteric sclera. HEENT: Normocephalic, moist mucous membranes, clear oropharnyx. Lungs: Clear to auscultation bilaterally. Heart: Regular rate and rhythm. No rubs, murmurs, or gallops. Abdomen: Soft, nontender, nondistended. No organomegaly noted, normoactive bowel sounds. Musculoskeletal: No edema, cyanosis, or clubbing. Neuro: Alert, answering  all questions appropriately. Cranial nerves grossly intact. Skin: No rashes or petechiae noted. Psych: Normal affect. Lymphatics: No cervical, calvicular, axillary or inguinal LAD.   LAB RESULTS:  Lab Results  Component Value Date   NA 140 03/14/2014   K 3.7 03/14/2014   CL 112 (H) 03/14/2014   CO2  21 03/14/2014   GLUCOSE 96 03/14/2014   BUN 11 03/14/2014   CREATININE 0.80 11/06/2016   CALCIUM 7.9 (L) 03/14/2014   PROT 6.6 03/12/2014   ALBUMIN 3.2 (L) 03/12/2014   AST 19 03/12/2014   ALT 21 03/12/2014   ALKPHOS 77 03/12/2014   BILITOT 0.4 03/12/2014   GFRNONAA >60 03/14/2014   GFRAA >60 03/14/2014    Lab Results  Component Value Date   WBC 8.1 03/11/2018   NEUTROABS 6.1 03/14/2014   HGB 9.5 (L) 03/11/2018   HCT 30.6 (L) 03/11/2018   MCV 78.3 (L) 03/11/2018   PLT 483 (H) 03/11/2018   Lab Results  Component Value Date   IRON 13 (L) 03/11/2018   TIBC 466 (H) 03/11/2018   IRONPCTSAT 3 (L) 03/11/2018   Lab Results  Component Value Date   FERRITIN 6 (L) 03/11/2018     STUDIES: No results found.  ASSESSMENT: Anemia, unspecified.  PLAN:    1. Anemia, unspecified: Patient's hemoglobin is significantly decreased along with her iron stores.  Her immature reticulocyte count and erythropoietin level are appropriately elevated.  The remainder of her laboratory work is either negative or within normal limits.  Patient reports negative stool cards.  She also reports a negative colonoscopy several years ago, we do not have these results.  Return to clinic in 1 and 2 weeks to receive 510 mg IV Feraheme.  Patient will then return to clinic in 3 months with repeat laboratory work, further evaluation, and consideration of additional IV iron. 2.  Thrombocytosis: Likely secondary to iron deficiency.  IV Feraheme as above.  I spent a total of 45 minutes face-to-face with the patient of which greater than 50% of the visit was spent in counseling and coordination of care as detailed above.   Patient expressed understanding and was in agreement with this plan. She also understands that She can call clinic at any time with any questions, concerns, or complaints.    Lloyd Huger, MD   03/12/2018 2:46 PM

## 2018-03-11 ENCOUNTER — Inpatient Hospital Stay: Payer: Medicare Other

## 2018-03-11 ENCOUNTER — Other Ambulatory Visit: Payer: Self-pay

## 2018-03-11 ENCOUNTER — Inpatient Hospital Stay: Payer: Medicare Other | Attending: Oncology | Admitting: Oncology

## 2018-03-11 DIAGNOSIS — D649 Anemia, unspecified: Secondary | ICD-10-CM

## 2018-03-11 DIAGNOSIS — M5030 Other cervical disc degeneration, unspecified cervical region: Secondary | ICD-10-CM | POA: Insufficient documentation

## 2018-03-11 DIAGNOSIS — R51 Headache: Secondary | ICD-10-CM

## 2018-03-11 DIAGNOSIS — G40219 Localization-related (focal) (partial) symptomatic epilepsy and epileptic syndromes with complex partial seizures, intractable, without status epilepticus: Secondary | ICD-10-CM | POA: Insufficient documentation

## 2018-03-11 DIAGNOSIS — D509 Iron deficiency anemia, unspecified: Secondary | ICD-10-CM | POA: Insufficient documentation

## 2018-03-11 DIAGNOSIS — D473 Essential (hemorrhagic) thrombocythemia: Secondary | ICD-10-CM | POA: Diagnosis not present

## 2018-03-11 DIAGNOSIS — I2699 Other pulmonary embolism without acute cor pulmonale: Secondary | ICD-10-CM | POA: Insufficient documentation

## 2018-03-11 DIAGNOSIS — E559 Vitamin D deficiency, unspecified: Secondary | ICD-10-CM | POA: Insufficient documentation

## 2018-03-11 LAB — LACTATE DEHYDROGENASE: LDH: 122 U/L (ref 98–192)

## 2018-03-11 LAB — RETICULOCYTES
Immature Retic Fract: 22.9 % — ABNORMAL HIGH (ref 2.3–15.9)
RBC.: 3.91 MIL/uL (ref 3.87–5.11)
Retic Count, Absolute: 46.9 10*3/uL (ref 19.0–186.0)
Retic Ct Pct: 1.2 % (ref 0.4–3.1)

## 2018-03-11 LAB — CBC
HCT: 30.6 % — ABNORMAL LOW (ref 36.0–46.0)
Hemoglobin: 9.5 g/dL — ABNORMAL LOW (ref 12.0–15.0)
MCH: 24.3 pg — ABNORMAL LOW (ref 26.0–34.0)
MCHC: 31 g/dL (ref 30.0–36.0)
MCV: 78.3 fL — AB (ref 80.0–100.0)
NRBC: 0 % (ref 0.0–0.2)
Platelets: 483 10*3/uL — ABNORMAL HIGH (ref 150–400)
RBC: 3.91 MIL/uL (ref 3.87–5.11)
RDW: 16.3 % — ABNORMAL HIGH (ref 11.5–15.5)
WBC: 8.1 10*3/uL (ref 4.0–10.5)

## 2018-03-11 LAB — VITAMIN B12: Vitamin B-12: 212 pg/mL (ref 180–914)

## 2018-03-11 LAB — DAT, POLYSPECIFIC AHG (ARMC ONLY): Polyspecific AHG test: NEGATIVE

## 2018-03-11 LAB — FERRITIN: Ferritin: 6 ng/mL — ABNORMAL LOW (ref 11–307)

## 2018-03-11 LAB — IRON AND TIBC
Iron: 13 ug/dL — ABNORMAL LOW (ref 28–170)
Saturation Ratios: 3 % — ABNORMAL LOW (ref 10.4–31.8)
TIBC: 466 ug/dL — ABNORMAL HIGH (ref 250–450)
UIBC: 453 ug/dL

## 2018-03-11 LAB — FOLATE: Folate: 20.9 ng/mL (ref >5.9)

## 2018-03-11 NOTE — Progress Notes (Signed)
Patient is here today to establish care for her anemia. Patient stated that she feels tired and fatigued all the time. Patient also stated that when she gets up while she'd been sitting, she gets dizzy. Patient also state dthat she gets SOB on exertion and nose bleeds several times in a month.

## 2018-03-12 DIAGNOSIS — D509 Iron deficiency anemia, unspecified: Secondary | ICD-10-CM | POA: Insufficient documentation

## 2018-03-12 LAB — PROTEIN ELECTROPHORESIS, SERUM
A/G Ratio: 1.3 (ref 0.7–1.7)
ALBUMIN ELP: 3.6 g/dL (ref 2.9–4.4)
Alpha-1-Globulin: 0.3 g/dL (ref 0.0–0.4)
Alpha-2-Globulin: 0.7 g/dL (ref 0.4–1.0)
Beta Globulin: 1.2 g/dL (ref 0.7–1.3)
Gamma Globulin: 0.6 g/dL (ref 0.4–1.8)
Globulin, Total: 2.7 g/dL (ref 2.2–3.9)
Total Protein ELP: 6.3 g/dL (ref 6.0–8.5)

## 2018-03-12 LAB — HAPTOGLOBIN: Haptoglobin: 161 mg/dL (ref 42–346)

## 2018-03-12 LAB — ERYTHROPOIETIN: Erythropoietin: 59.6 m[IU]/mL — ABNORMAL HIGH (ref 2.6–18.5)

## 2018-03-15 ENCOUNTER — Other Ambulatory Visit: Payer: Self-pay | Admitting: Oncology

## 2018-03-16 ENCOUNTER — Encounter: Payer: Self-pay | Admitting: Oncology

## 2018-03-17 ENCOUNTER — Inpatient Hospital Stay: Payer: Medicare Other

## 2018-03-17 VITALS — BP 124/80 | HR 75 | Temp 98.3°F | Resp 18

## 2018-03-17 DIAGNOSIS — D509 Iron deficiency anemia, unspecified: Secondary | ICD-10-CM

## 2018-03-17 MED ORDER — SODIUM CHLORIDE 0.9 % IV SOLN
510.0000 mg | Freq: Once | INTRAVENOUS | Status: AC
  Administered 2018-03-17: 510 mg via INTRAVENOUS
  Filled 2018-03-17: qty 17

## 2018-03-17 MED ORDER — SODIUM CHLORIDE 0.9 % IV SOLN
Freq: Once | INTRAVENOUS | Status: AC
  Administered 2018-03-17: 14:00:00 via INTRAVENOUS
  Filled 2018-03-17: qty 250

## 2018-03-24 ENCOUNTER — Inpatient Hospital Stay: Payer: Medicare Other

## 2018-03-24 VITALS — BP 122/74 | HR 80 | Resp 18

## 2018-03-24 DIAGNOSIS — D509 Iron deficiency anemia, unspecified: Secondary | ICD-10-CM | POA: Diagnosis not present

## 2018-03-24 MED ORDER — SODIUM CHLORIDE 0.9 % IV SOLN
Freq: Once | INTRAVENOUS | Status: AC
  Administered 2018-03-24: 14:00:00 via INTRAVENOUS
  Filled 2018-03-24: qty 250

## 2018-03-24 MED ORDER — SODIUM CHLORIDE 0.9 % IV SOLN
510.0000 mg | Freq: Once | INTRAVENOUS | Status: AC
  Administered 2018-03-24: 510 mg via INTRAVENOUS
  Filled 2018-03-24: qty 17

## 2018-04-26 ENCOUNTER — Other Ambulatory Visit: Payer: Self-pay | Admitting: Internal Medicine

## 2018-04-26 DIAGNOSIS — R410 Disorientation, unspecified: Secondary | ICD-10-CM

## 2018-05-05 ENCOUNTER — Emergency Department: Payer: Medicare Other

## 2018-05-05 ENCOUNTER — Encounter: Payer: Self-pay | Admitting: Emergency Medicine

## 2018-05-05 ENCOUNTER — Ambulatory Visit
Admission: RE | Admit: 2018-05-05 | Discharge: 2018-05-05 | Disposition: A | Discharge status: Home / Self Care | Payer: Medicare Other | Source: Ambulatory Visit | Attending: Internal Medicine | Admitting: Internal Medicine

## 2018-05-05 ENCOUNTER — Other Ambulatory Visit: Payer: Self-pay

## 2018-05-05 ENCOUNTER — Emergency Department
Admission: EM | Admit: 2018-05-05 | Discharge: 2018-05-05 | Disposition: A | Discharge status: Home / Self Care | Payer: Medicare Other | Attending: Emergency Medicine | Admitting: Emergency Medicine

## 2018-05-05 DIAGNOSIS — M79661 Pain in right lower leg: Secondary | ICD-10-CM | POA: Insufficient documentation

## 2018-05-05 DIAGNOSIS — Z79899 Other long term (current) drug therapy: Secondary | ICD-10-CM | POA: Diagnosis not present

## 2018-05-05 DIAGNOSIS — Z7901 Long term (current) use of anticoagulants: Secondary | ICD-10-CM | POA: Diagnosis not present

## 2018-05-05 DIAGNOSIS — M7918 Myalgia, other site: Secondary | ICD-10-CM

## 2018-05-05 DIAGNOSIS — R410 Disorientation, unspecified: Secondary | ICD-10-CM | POA: Insufficient documentation

## 2018-05-05 HISTORY — DX: Acute embolism and thrombosis of unspecified deep veins of unspecified lower extremity: I82.409

## 2018-05-05 NOTE — ED Triage Notes (Signed)
Here for right calf pain.  Hx of DVT/PE. Pt is on xarelto but c/o bee stinging type pain to right calf for 3 days.  It feels like when had DVT and would like to make sure she does not have one again.  Has not missed any xarelto doses.  Unlabored.

## 2018-05-05 NOTE — ED Provider Notes (Signed)
[facility] Emergency Department Provider Note  Time seen: 4:36 PM  I have reviewed the triage vital signs and the nursing notes.   HISTORY  Chief Complaint Leg Pain    HPI Julia Klein is a 73 y.o. female with a past medical history of DVT, PE currently on Xarelto who presents to the emergency department for 3 days of right calf pain.  According to the patient in the past 3 days she has been experiencing intermittent discomfort in the right calf.  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  Has not missed any doses of medication.  Patient was concerned over the possibility of another DVT so she came to the emergency department for evaluation.   Past Medical History:  Diagnosis Date  . DVT (deep venous thrombosis) ([place])   . Pulmonary embolism ([place])   . Seizures [facility])     Patient Active Problem List   Diagnosis Date Noted  . Iron deficiency anemia 03/12/2018  . Degenerative disc disease, cervical 03/11/2018  . Headache 03/11/2018  . Pulmonary embolism ([place]) 03/11/2018  . 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 Vitamin D deficiency 03/11/2018  . Anemia, unspecified 03/05/2018  . Bruising 10/14/2017  . GERD (gastroesophageal reflux disease) 07/14/2016  . RUQ pain 07/14/2016  . Obesity, Class II, BMI 35-39.9 12/10/2015  . CAD (coronary artery disease) 02/08/2014  . Long term current use of anticoagulant therapy 12/07/2013  . Mitral valve insufficiency 08/15/2013    Past Surgical History:  Procedure Laterality Date  . ABDOMINAL HYSTERECTOMY    . SHOULDER SURGERY Left     Prior to Admission medications   Medication Sig Start Date End Date Taking? Authorizing Provider  HYDROcodone-acetaminophen (NORCO/VICODIN) 5-325 MG tablet Take 1 tablet by mouth every 4 (four) hours as needed for moderate pain. 03/06/17   Cuthriell, Charline Bills, PA-C  rivaroxaban (XARELTO) 10 MG  TABS tablet Take 1 tablet by mouth. 09/09/17   [provider]  topiramate (TOPAMAX) 50 MG tablet Take 1 tablet by mouth 1 day or 1 dose. 12/24/17   [provider]    Allergies  Allergen Reactions  . Amoxicillin-Pot Clavulanate Diarrhea and Nausea And Vomiting  . Esomeprazole Magnesium Other (See Comments)  . Ivp Dye [Iodinated Diagnostic Agents] Hives  . Levofloxacin Rash and Swelling  . Omeprazole Other (See Comments) and Palpitations    Family History  Problem Relation Age of Onset  . Breast cancer Paternal Aunt     Social History Social History   Tobacco Use  . Smoking status: Never Smoker  . Smokeless tobacco: Never Used  Substance Use Topics  . Alcohol use: No    Frequency: Never  . Drug use: No    Review of Systems Constitutional: Negative for fever. Cardiovascular: Negative for chest pain. Respiratory: Negative for shortness of breath. Gastrointestinal: Negative for abdominal pain, vomiting and diarrhea. Musculoskeletal: Intermittent right calf pain Skin: Negative for skin complaints  Neurological: Negative for headache All other ROS negative  ____________________________________________   PHYSICAL EXAM:  VITAL SIGNS: ED Triage Vitals  Enc Vitals Group     BP 05/05/18 1451 (!) 125/59     Pulse Rate 05/05/18 1451 87     Resp 05/05/18 1451 16     Temp 05/05/18 1451 98.2 F (36.8 C)     Temp Source 05/05/18 1451 Oral     SpO2 05/05/18 1451 99 %  Weight 05/05/18 1448 225 lb (102.1 kg)     Height 05/05/18 1448 5\' 7"  (1.702 m)     Head Circumference --      Peak Flow --      Pain Score 05/05/18 1448 7     Pain Loc --      Pain Edu? --      Excl. in [place]? --    Constitutional: Alert and oriented. Well appearing and in no distress. Eyes: Normal exam ENT   Head: Normocephalic and atraumatic   Mouth/Throat: Mucous membranes are moist. Cardiovascular: Normal rate, regular rhythm.  Respiratory: Normal respiratory effort  without tachypnea nor retractions. Breath sounds are clear Gastrointestinal: Soft and nontender. No distention.   Musculoskeletal: Slight tenderness to palpation of the right distal calf.  Warm to touch, 2+ DP pulse, sensation intact.  No edema, erythema or palpable cords. Neurologic:  Normal speech and language. No gross focal neurologic deficits  Skin:  Skin is warm, dry and intact.  Psychiatric: Mood and affect are normal.     RADIOLOGY  Ultrasound negative for DVT  ____________________________________________   INITIAL IMPRESSION / ASSESSMENT AND PLAN / ED COURSE  Pertinent labs & imaging results that were available during my care of the patient were reviewed by me and considered in my medical decision making (see chart for details).  Patient presents to the emergency department for right leg discomfort intermittent over the past 3 days with a history of DVT and PE in the past.  Currently on Xarelto, was concerned about the possibility of a recurrent DVT so she came to the emergency department for evaluation.  Overall the patient appears extremely well, minimal tenderness on palpation.  Neurovascularly intact distally.  Patient's ultrasound is negative for DVT, highly suspect musculoskeletal discomfort.  I recommended warm compresses, Tylenol for discomfort.  Patient will follow-up with her doctor.  ____________________________________________   FINAL CLINICAL IMPRESSION(S) / ED DIAGNOSES  Musculoskeletal pain   Harvest Dark, MD 05/05/18 [phone]

## 2018-05-05 NOTE — ED Notes (Addendum)
PT c/o right leg pain, starting at her foot and working its way up. Pt reports has had history of blood clots and a pulmonary embolism without any symptoms and would like to get checked. Able to ambulate. PT does take xarelto.

## 2018-05-28 IMAGING — MG MM DIGITAL DIAGNOSTIC UNILAT*R* W/ TOMO W/ CAD
6 of 9 series · 6 of 21 positions shown · non-contrast
Comparison: Previous exam(s).

CLINICAL DATA: Right breast upper inner quadrant area of focal
pain. Patient has had a course of oral antibiotics with partial
relief of the pain.

EXAM:
DIGITAL DIAGNOSTIC RIGHT MAMMOGRAM WITH CAD AND TOMO
ULTRASOUND RIGHT BREAST

[R MLO synth-2D]
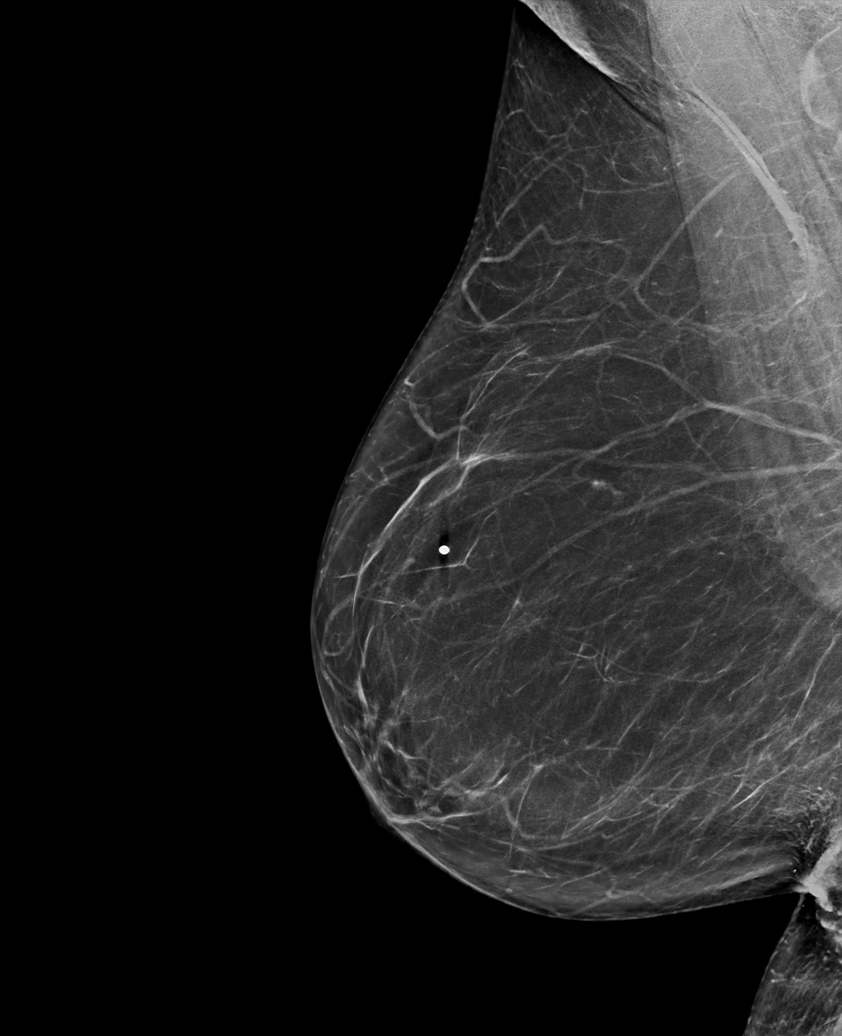

[R CC (1 of 2)]
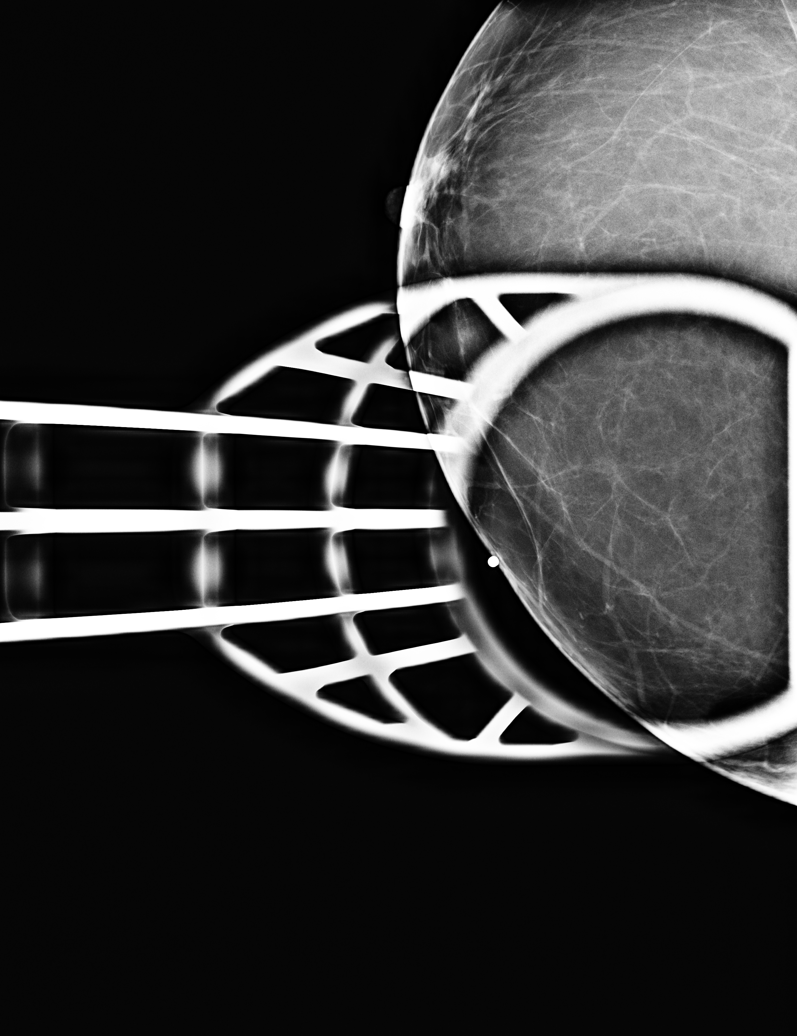

[R CC (2 of 2)]
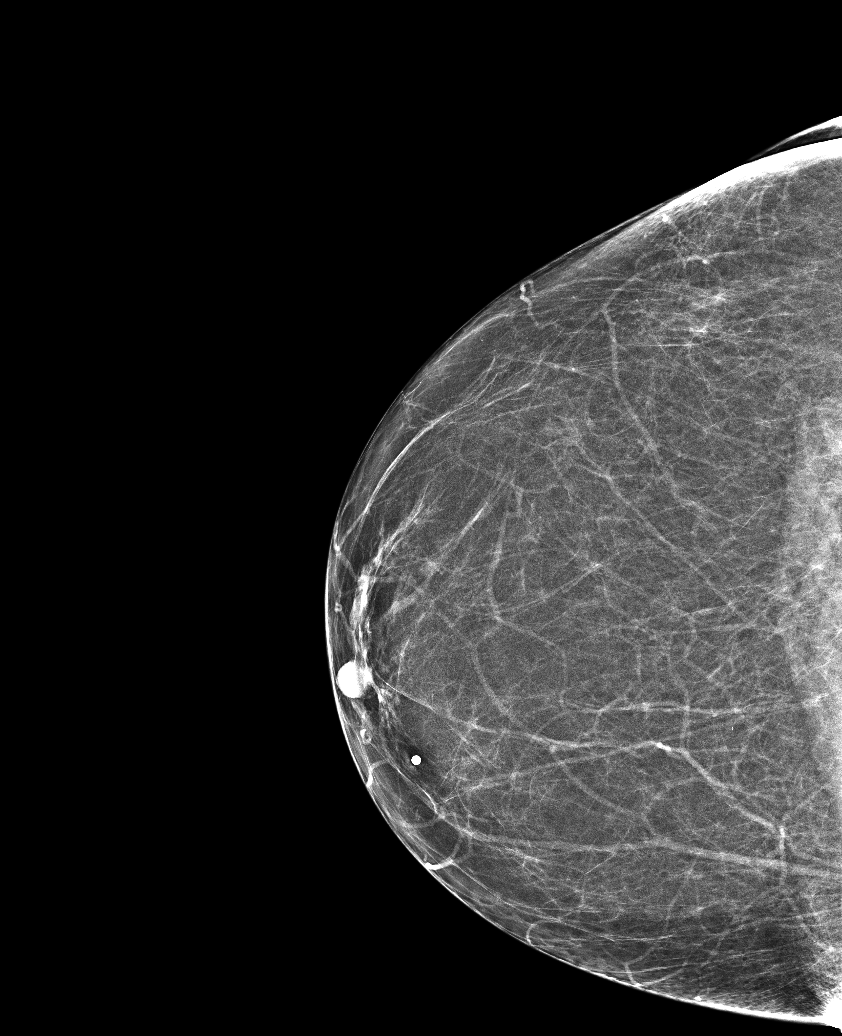

[R CC synth-2D (1 of 2)]
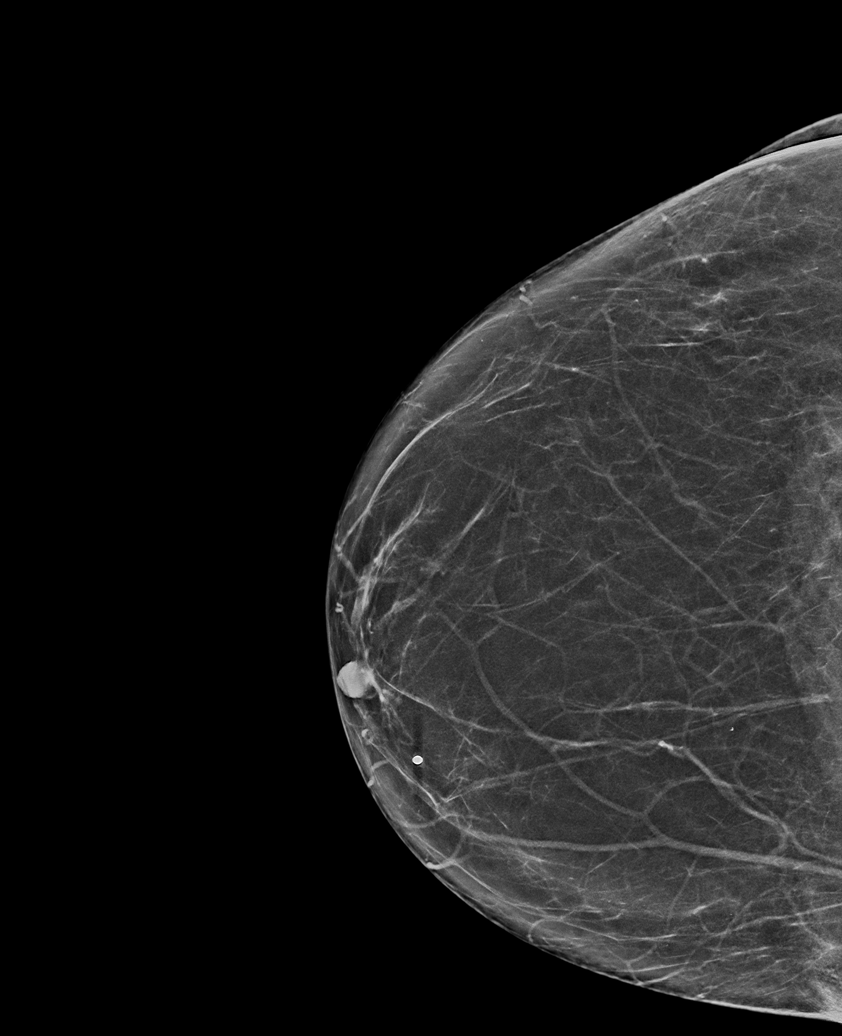

[R MLO]
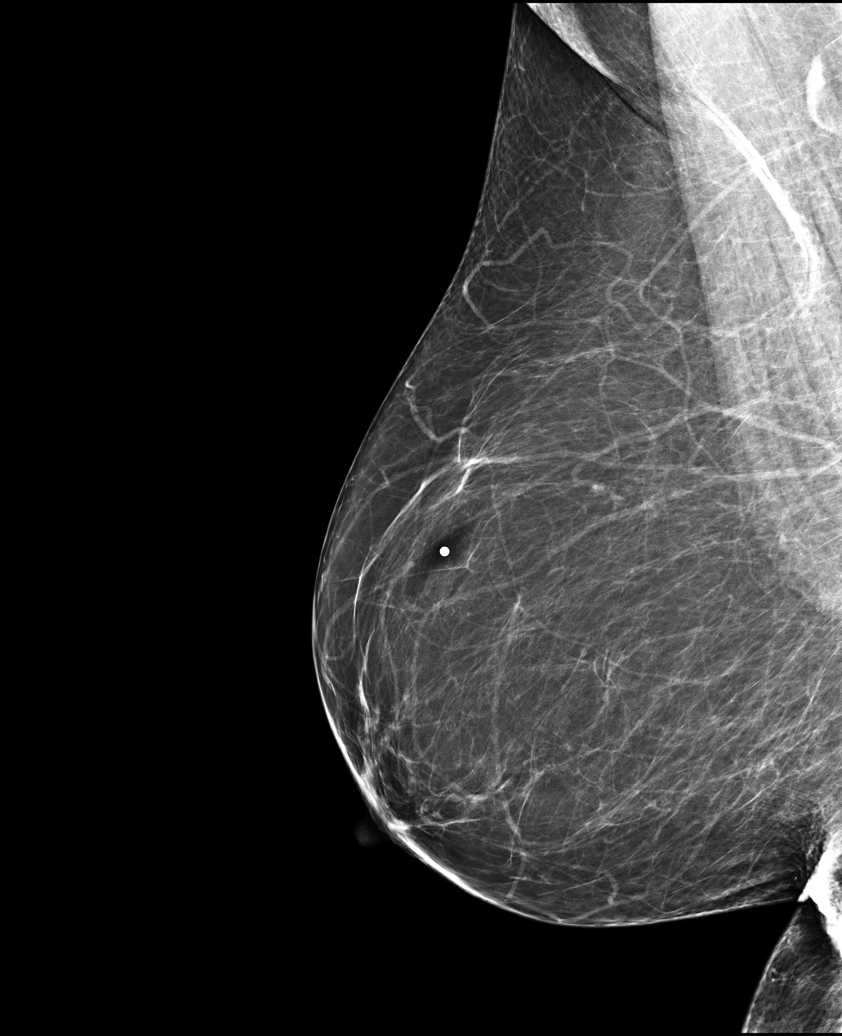

[R CC synth-2D (2 of 2)]
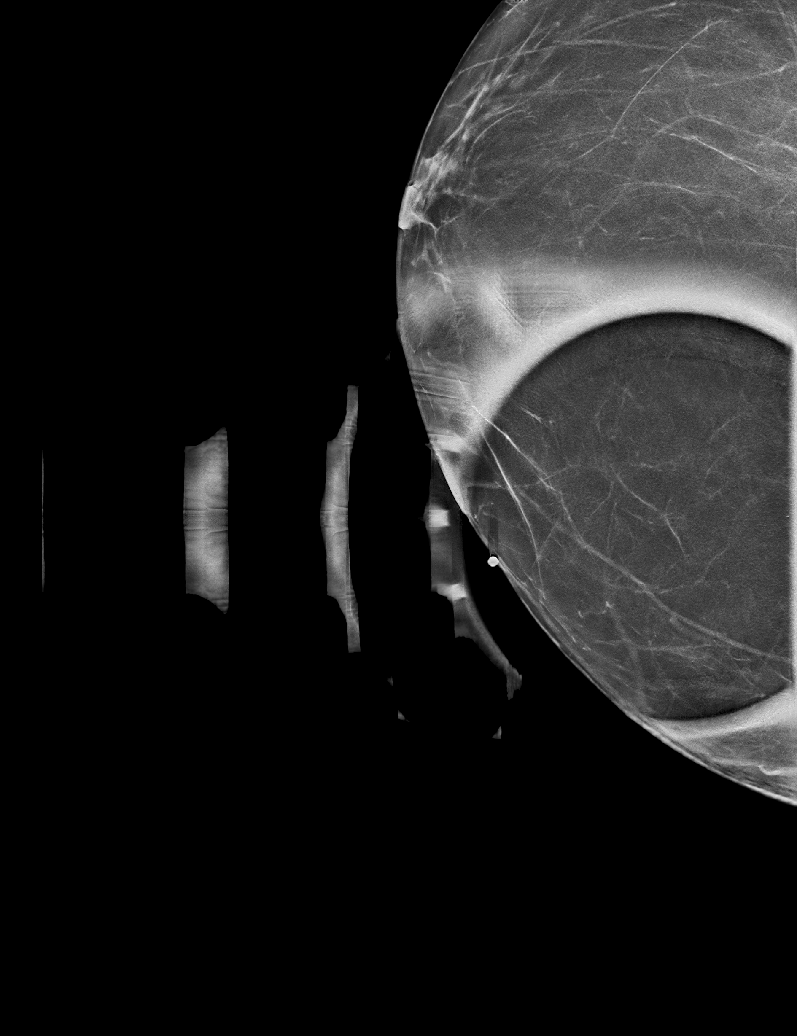

[6 of 21 positions shown; findings below may reference images not displayed]

ACR Breast Density Category b: There are scattered areas of
fibroglandular density.
FINDINGS: Mammographically, there are no suspicious masses, areas of
architectural distortion or microcalcifications in the right breast.

Mammographic images were processed with CAD.

On physical exam, no suspicious masses are palpated. There is a
reproducible focal pain in the right 2 o'clock breast, anterior
depth, phone compression.

Targeted ultrasound is performed, showing no suspicious masses or
shadowing lesions in the area of pain.
IMPRESSION: No mammographic or sonographic evidence of malignancy in the right
breast.

RECOMMENDATION:
Further management of patient's focal right breast pain should be
based clinical grounds.

Otherwise, bilateral screening mammogram is recommended in August 2017.

I have discussed the findings and recommendations with the patient.
Results were also provided in writing at the conclusion of the
visit. If applicable, a reminder letter will be sent to the patient
regarding the next appointment.

BI-RADS CATEGORY  1: Negative.

## 2018-06-14 ENCOUNTER — Other Ambulatory Visit: Payer: Self-pay

## 2018-06-15 ENCOUNTER — Other Ambulatory Visit: Payer: Self-pay

## 2018-06-15 ENCOUNTER — Other Ambulatory Visit: Payer: Self-pay | Admitting: Oncology

## 2018-06-15 ENCOUNTER — Inpatient Hospital Stay: Payer: Medicare Other | Attending: Oncology

## 2018-06-15 ENCOUNTER — Ambulatory Visit: Payer: Medicare Other

## 2018-06-15 ENCOUNTER — Ambulatory Visit: Payer: Medicare Other | Admitting: Oncology

## 2018-06-15 DIAGNOSIS — D509 Iron deficiency anemia, unspecified: Secondary | ICD-10-CM | POA: Insufficient documentation

## 2018-06-15 DIAGNOSIS — D649 Anemia, unspecified: Secondary | ICD-10-CM

## 2018-06-15 LAB — CBC WITH DIFFERENTIAL/PLATELET
Abs Immature Granulocytes: 0.02 10*3/uL (ref 0.00–0.07)
Basophils Absolute: 0.1 10*3/uL (ref 0.0–0.1)
Basophils Relative: 1 %
Eosinophils Absolute: 0.2 10*3/uL (ref 0.0–0.5)
Eosinophils Relative: 3 %
HCT: 40 % (ref 36.0–46.0)
Hemoglobin: 13.3 g/dL (ref 12.0–15.0)
Immature Granulocytes: 0 %
Lymphocytes Relative: 30 %
Lymphs Abs: 2.6 10*3/uL (ref 0.7–4.0)
MCH: 30.2 pg (ref 26.0–34.0)
MCHC: 33.3 g/dL (ref 30.0–36.0)
MCV: 90.9 fL (ref 80.0–100.0)
Monocytes Absolute: 0.7 10*3/uL (ref 0.1–1.0)
Monocytes Relative: 8 %
Neutro Abs: 5.2 10*3/uL (ref 1.7–7.7)
Neutrophils Relative %: 58 %
Platelets: 379 10*3/uL (ref 150–400)
RBC: 4.4 MIL/uL (ref 3.87–5.11)
RDW: 16.6 % — ABNORMAL HIGH (ref 11.5–15.5)
WBC: 8.8 10*3/uL (ref 4.0–10.5)
nRBC: 0 % (ref 0.0–0.2)

## 2018-06-15 LAB — IRON AND TIBC
Iron: 59 ug/dL (ref 28–170)
Saturation Ratios: 15 % (ref 10.4–31.8)
TIBC: 408 ug/dL (ref 250–450)
UIBC: 349 ug/dL

## 2018-06-15 LAB — FERRITIN: Ferritin: 16 ng/mL (ref 11–307)

## 2018-06-16 ENCOUNTER — Telehealth: Payer: Self-pay | Admitting: *Deleted

## 2018-06-16 NOTE — Telephone Encounter (Signed)
Brooke, please schedule patient for Web Ex her request and Dr Virgel Manifold too

## 2018-06-16 NOTE — Telephone Encounter (Signed)
No, hbg and iron stores are WNL.Marland Kitchen she can have a webex to discuss if she wants.

## 2018-06-16 NOTE — Telephone Encounter (Signed)
Patient called asking for results and wants to know if she will need IV iron tomorrow. Please advise  CBC with Differential/Platelet  Order: [id number]  Status:  Final result  Visible to patient:  No (Not Released)  Next appt:  None  Dx:  Symptomatic anemia   Ref Range & Units 1d ago (06/15/18) 66mo ago (03/11/18) 76yr ago (03/14/14) 15yr ago (03/13/14) 4yr ago (03/12/14)  WBC 4.0 - 10.5 K/uL 8.8  8.1  8.3 R 9.1 R 7.2 R  RBC 3.87 - 5.11 MIL/uL 4.40  3.91  4.06 R 4.60 R 4.11 R  Hemoglobin 12.0 - 15.0 g/dL 13.3  9.5Low  CM     HCT 36.0 - 46.0 % 40.0  30.6Low       MCV 80.0 - 100.0 fL 90.9  78.3Low   95 R 94 R 94 R  MCH 26.0 - 34.0 pg 30.2  24.3Low   31.0  30.9  31.9   MCHC 30.0 - 36.0 g/dL 33.3  31.0  32.6 R 32.7 R 34.1 R  RDW 11.5 - 15.5 % 16.6High   16.3High   12.9 R 13.3 R 13.2 R  Platelets 150 - 400 K/uL 379  483High   296 R 361 R 340 R  nRBC 0.0 - 0.2 % 0.0  0.0 CM     Neutrophils Relative % % 58   73.7  36.4    Neutro Abs 1.7 - 7.7 K/uL 5.2   6.1 R 3.3 R   Lymphocytes Relative % 30   14.9  49.6    Lymphs Abs 0.7 - 4.0 K/uL 2.6   1.2 R 4.5High  R   Monocytes Relative % 8   5.2  7.1    Monocytes Absolute 0.1 - 1.0 K/uL 0.7   0.4 R 0.6 R   Eosinophils Relative % 3   6.0  5.5    Eosinophils Absolute 0.0 - 0.5 K/uL 0.2   0.5 R 0.5 R   Basophils Relative % 1   0.2  1.4    Basophils Absolute 0.0 - 0.1 K/uL 0.1   0.0 R 0.1 R   Immature Granulocytes % 0       Abs Immature Granulocytes 0.00 - 0.07 K/uL 0.02       Comment: Performed at [facility], [place], [place] [address]  HGB    12.6 R 14.2 R 13.1 R  HCT    38.5 R 43.4 R 38.4 R  Resulting Agency  [facility]      Specimen Collected: 06/15/18 12:56  Last Resulted: 06/15/18 13:08     Lab Flowsheet    Order Details    View Encounter    Lab and Collection Details    Routing    Result History      CM=Additional  commentsR=Reference range differs from displayed range      Other Results from 06/15/2018   Iron and TIBC  Order: [id number]   Status:  Final result  Visible to patient:  No (Not Released)  Next appt:  None  Dx:  Symptomatic anemia   Ref Range & Units 1d ago 29mo ago  Iron 28 - 170 ug/dL 59  13Low    TIBC 250 - 450 ug/dL 408  466High    Saturation Ratios 10.4 - 31.8 % 15  3Low    UIBC ug/dL 349  453 CM  Comment: Performed at [facility]  Lab, [address] [place], [place] [address]  Resulting Agency  [facility]      Specimen Collected: 06/15/18 12:56  Last Resulted: 06/15/18 14:26     Lab Flowsheet    Order Details    View Encounter    Lab and Collection Details    Routing    Result History      CM=Additional comments        Ferritin  Order: [id number]   Status:  Final result  Visible to patient:  No (Not Released)  Next appt:  None  Dx:  Symptomatic anemia   Ref Range & Units 1d ago 32mo ago  Ferritin 11 - 307 ng/mL 16  6Low  CM  Comment: Performed at [facility], [address] [place], [place] [address]  Resulting Agency  [facility] CL[facility] LAB [facility] LAB      Specimen Collected: 06/15/18 12:56  Last Resulted: 06/15/18 14:26

## 2018-06-17 ENCOUNTER — Encounter: Payer: Self-pay | Admitting: Oncology

## 2018-06-22 ENCOUNTER — Inpatient Hospital Stay (HOSPITAL_BASED_OUTPATIENT_CLINIC_OR_DEPARTMENT_OTHER): Payer: Medicare Other | Admitting: Oncology

## 2018-06-22 ENCOUNTER — Other Ambulatory Visit: Payer: Self-pay

## 2018-06-22 ENCOUNTER — Encounter: Payer: Self-pay | Admitting: Oncology

## 2018-06-22 DIAGNOSIS — D473 Essential (hemorrhagic) thrombocythemia: Secondary | ICD-10-CM

## 2018-06-22 DIAGNOSIS — D509 Iron deficiency anemia, unspecified: Secondary | ICD-10-CM | POA: Diagnosis not present

## 2018-06-22 NOTE — Progress Notes (Signed)
Patient had been doing well in reference to her anemia. However, patient stated that she recently was diagnosed with hypoglycemia.

## 2018-06-22 NOTE — Progress Notes (Signed)
[place]  Telephone:(336) [phone] Fax:(336) [phone]  ID: Lonie Peak OB: 04-08-1945  MR#: [id number]  AOZ#:[id number]  Patient Care Team: Idelle Crouch, MD as PCP - General (Internal Medicine)  I connected with Lonie Peak on 06/22/18 at 11:00 AM EDT by video enabled telemedicine visit and verified that I am speaking with the correct person using two identifiers.   I discussed the limitations, risks, security and privacy concerns of performing an evaluation and management service by telemedicine and the availability of in-person appointments. I also discussed with the patient that there may be a patient responsible charge related to this service. The patient expressed understanding and agreed to proceed.   Other persons participating in the visit and their role in the encounter: Patient, nursing  Patient's location: Home Provider's location: Clinic  CHIEF COMPLAINT: Iron deficiency anemia.  INTERVAL HISTORY: Patient agreed to video visit to discuss her laboratory work and further evaluation.  She continues to have episodes of fatigue, but blames this on newly diagnosed "hypoglycemia".  She otherwise feels well.  She has no neurologic complaints.  She denies any recent fevers or illnesses.  She has a good appetite and denies weight loss.  She denies any chest pain, shortness of breath, cough, or hemoptysis.  She denies any nausea, vomiting, constipation, or diarrhea.  She has no melena or hematochezia.  She has no urinary complaints.  Patient offers no further specific complaints today.  REVIEW OF SYSTEMS:   Review of Systems  Constitutional: Positive for malaise/fatigue. Negative for fever and weight loss.  Respiratory: Negative.  Negative for cough, hemoptysis and shortness of breath.   Cardiovascular: Negative.  Negative for chest pain and leg swelling.  Gastrointestinal: Negative.  Negative for abdominal pain, blood in stool and melena.   Genitourinary: Negative.  Negative for hematuria.  Musculoskeletal: Negative.  Negative for back pain.  Skin: Negative.  Negative for rash.  Neurological: Negative.  Negative for dizziness, focal weakness, weakness and headaches.  Psychiatric/Behavioral: Negative.  The patient is not nervous/anxious.     As per HPI. Otherwise, a complete review of systems is negative.  PAST MEDICAL HISTORY: Past Medical History:  Diagnosis Date  . DVT (deep venous thrombosis) ([place])   . Pulmonary embolism ([place])   . Seizures ([place])     PAST SURGICAL HISTORY: Past Surgical History:  Procedure Laterality Date  . ABDOMINAL HYSTERECTOMY    . SHOULDER SURGERY Left     FAMILY HISTORY: Family History  Problem Relation Age of Onset  . Breast cancer Paternal Aunt     ADVANCED DIRECTIVES (Y/N):  N  HEALTH MAINTENANCE: Social History   Tobacco Use  . Smoking status: Never Smoker  . Smokeless tobacco: Never Used  Substance Use Topics  . Alcohol use: No    Frequency: Never  . Drug use: No     Colonoscopy:  PAP:  Bone density:  Lipid panel:  Allergies  Allergen Reactions  . Amoxicillin-Pot Clavulanate Diarrhea and Nausea And Vomiting  . Esomeprazole Magnesium Other (See Comments)  . Ivp Dye [Iodinated Diagnostic Agents] Hives  . Gabapentin Rash  . Levofloxacin Rash and Swelling  . Omeprazole Other (See Comments) and Palpitations    Current Outpatient Medications  Medication Sig Dispense Refill  . cyanocobalamin (,VITAMIN B-12,) 1000 MCG/ML injection Inject 1 mg into the muscle 1 day or 1 dose.    . rivaroxaban (XARELTO) 10 MG TABS tablet Take 1 tablet by mouth.    Marland Kitchen tiZANidine (ZANAFLEX) 4 MG tablet Take  1 tablet by mouth 1 day or 1 dose.    . topiramate (TOPAMAX) 50 MG tablet Take 1 tablet by mouth 1 day or 1 dose.    . Vitamin D, Ergocalciferol, (DRISDOL) 1.25 MG (50000 UT) CAPS capsule Take 1 capsule by mouth 1 day or 1 dose.     No current facility-administered medications for  this visit.     OBJECTIVE: There were no vitals filed for this visit.   There is no height or weight on file to calculate BMI.    ECOG FS:0 - Asymptomatic  General: Well-developed, well-nourished, no acute distress. HEENT: Normocephalic. Neuro: Alert, answering all questions appropriately. Cranial nerves grossly intact. Skin: No rashes or petechiae noted. Psych: Normal affect.  LAB RESULTS:  Lab Results  Component Value Date   NA 140 03/14/2014   K 3.7 03/14/2014   CL 112 (H) 03/14/2014   CO2 21 03/14/2014   GLUCOSE 96 03/14/2014   BUN 11 03/14/2014   CREATININE 0.80 11/06/2016   CALCIUM 7.9 (L) 03/14/2014   PROT 6.6 03/12/2014   ALBUMIN 3.2 (L) 03/12/2014   AST 19 03/12/2014   ALT 21 03/12/2014   ALKPHOS 77 03/12/2014   BILITOT 0.4 03/12/2014   GFRNONAA >60 03/14/2014   GFRAA >60 03/14/2014    Lab Results  Component Value Date   WBC 8.8 06/15/2018   NEUTROABS 5.2 06/15/2018   HGB 13.3 06/15/2018   HCT 40.0 06/15/2018   MCV 90.9 06/15/2018   PLT 379 06/15/2018   Lab Results  Component Value Date   IRON 59 06/15/2018   TIBC 408 06/15/2018   IRONPCTSAT 15 06/15/2018   Lab Results  Component Value Date   FERRITIN 16 06/15/2018     STUDIES: No results found.  ASSESSMENT: Iron deficiency anemia.  PLAN:    1. Anemia, unspecified: Patient's hemoglobin and iron stores are now within normal limits.  Previously, all of her other laboratory work was either negative or within normal limits.  She also reports negative stool cards.  She had a negative colonoscopy several years ago, we do not have these results.  Patient attributes her iron deficiency anemia to a poor appetite over the past year after the death of her husband.  She was recently received IV Feraheme on March 24, 2018.  She does not require additional infusion today.  No further interventions are needed.  Return to clinic in 3 months with repeat laboratory work and further evaluation. 2.   Thrombocytosis: Resolved. 3.  Fatigue: Possibly related to "hypoglycemia".  Continue follow-up, evaluation, and treatment by primary care.    I provided 20 minutes of face-to-face video visit time during this encounter, and > 50% was spent counseling as documented under my assessment & plan.   Patient expressed understanding and was in agreement with this plan. She also understands that She can call clinic at any time with any questions, concerns, or complaints.    Lloyd Huger, MD   06/22/2018 2:47 PM

## 2018-06-24 DIAGNOSIS — I6523 Occlusion and stenosis of bilateral carotid arteries: Secondary | ICD-10-CM | POA: Insufficient documentation

## 2018-06-24 DIAGNOSIS — E538 Deficiency of other specified B group vitamins: Secondary | ICD-10-CM | POA: Insufficient documentation

## 2018-07-12 ENCOUNTER — Ambulatory Visit: Admit: 2018-07-12 | Payer: Medicare Other | Admitting: Unknown Physician Specialty

## 2018-07-12 SURGERY — ESOPHAGOGASTRODUODENOSCOPY (EGD) WITH PROPOFOL
Anesthesia: General

## 2018-07-12 SURGERY — COLONOSCOPY WITH PROPOFOL
Anesthesia: General

## 2018-08-06 ENCOUNTER — Other Ambulatory Visit: Payer: Self-pay | Admitting: Physician Assistant

## 2018-08-06 ENCOUNTER — Ambulatory Visit
Admission: RE | Admit: 2018-08-06 | Discharge: 2018-08-06 | Disposition: A | Discharge status: Home / Self Care | Payer: Medicare Other | Source: Ambulatory Visit | Attending: Physician Assistant | Admitting: Physician Assistant

## 2018-08-06 ENCOUNTER — Other Ambulatory Visit: Payer: Self-pay

## 2018-08-06 DIAGNOSIS — M7989 Other specified soft tissue disorders: Secondary | ICD-10-CM

## 2018-09-19 NOTE — Progress Notes (Signed)
Julia Klein  Telephone:(336) [phone] Fax:(336) [phone]  ID: Julia Klein OB: 12-24-1945  MR#: [id number]  HBZ#:[id number]  Patient Care Team: Idelle Crouch, MD as PCP - General (Internal Medicine)   CHIEF COMPLAINT: Iron deficiency anemia.  INTERVAL HISTORY: Patient returns to clinic today for repeat laboratory work, further evaluation, and consideration of additional IV iron.  She currently feels well and is asymptomatic.  She denies any weakness or fatigue. She has no neurologic complaints.  She denies any recent fevers or illnesses.  She has a good appetite and denies weight loss.  She denies any chest pain, shortness of breath, cough, or hemoptysis.  She denies any nausea, vomiting, constipation, or diarrhea.  She has no melena or hematochezia.  She has no urinary complaints.  Patient feels at her baseline offers no specific complaints today.  REVIEW OF SYSTEMS:   Review of Systems  Constitutional: Negative.  Negative for fever, malaise/fatigue and weight loss.  Respiratory: Negative.  Negative for cough, hemoptysis and shortness of breath.   Cardiovascular: Negative.  Negative for chest pain and leg swelling.  Gastrointestinal: Negative.  Negative for abdominal pain, blood in stool and melena.  Genitourinary: Negative.  Negative for hematuria.  Musculoskeletal: Negative.  Negative for back pain.  Skin: Negative.  Negative for rash.  Neurological: Negative.  Negative for dizziness, focal weakness, weakness and headaches.  Psychiatric/Behavioral: Negative.  The patient is not nervous/anxious.     As per HPI. Otherwise, a complete review of systems is negative.  PAST MEDICAL HISTORY: Past Medical History:  Diagnosis Date  . DVT (deep venous thrombosis) ([place])   . Pulmonary embolism ([place])   . Seizures ([place])     PAST SURGICAL HISTORY: Past Surgical History:  Procedure Laterality Date  . ABDOMINAL HYSTERECTOMY    . SHOULDER SURGERY Left     FAMILY  HISTORY: Family History  Problem Relation Age of Onset  . Breast cancer Paternal Aunt     ADVANCED DIRECTIVES (Y/N):  N  HEALTH MAINTENANCE: Social History   Tobacco Use  . Smoking status: Never Smoker  . Smokeless tobacco: Never Used  Substance Use Topics  . Alcohol use: No    Frequency: Never  . Drug use: No     Colonoscopy:  PAP:  Bone density:  Lipid panel:  Allergies  Allergen Reactions  . Amoxicillin-Pot Clavulanate Diarrhea and Nausea And Vomiting  . Esomeprazole Magnesium Other (See Comments)  . Ivp Dye [Iodinated Diagnostic Agents] Hives  . Gabapentin Rash  . Levofloxacin Rash and Swelling  . Omeprazole Other (See Comments) and Palpitations    Current Outpatient Medications  Medication Sig Dispense Refill  . cyanocobalamin (,VITAMIN B-12,) 1000 MCG/ML injection Inject 1 mg into the muscle 1 day or 1 dose.    . rivaroxaban (XARELTO) 10 MG TABS tablet Take 1 tablet by mouth.    . topiramate (TOPAMAX) 50 MG tablet Take 1 tablet by mouth 1 day or 1 dose.    . Vitamin D, Ergocalciferol, (DRISDOL) 1.25 MG (50000 UT) CAPS capsule Take 1 capsule by mouth 1 day or 1 dose.    Marland Kitchen tiZANidine (ZANAFLEX) 4 MG tablet Take 1 tablet by mouth 1 day or 1 dose.     No current facility-administered medications for this visit.     OBJECTIVE: Vitals:   09/24/18 1317  BP: 130/81  Pulse: 85  Resp: 18  Temp: 97.7 F (36.5 C)     Body mass index is 34.07 kg/m.    ECOG FS:0 -  Asymptomatic  General: Well-developed, well-nourished, no acute distress. Eyes: Pink conjunctiva, anicteric sclera. HEENT: Normocephalic, moist mucous membranes. Lungs: Clear to auscultation bilaterally. Heart: Regular rate and rhythm. No rubs, murmurs, or gallops. Abdomen: Soft, nontender, nondistended. No organomegaly noted, normoactive bowel sounds. Musculoskeletal: No edema, cyanosis, or clubbing. Neuro: Alert, answering all questions appropriately. Cranial nerves grossly intact. Skin: No rashes  or petechiae noted. Psych: Normal affect.  LAB RESULTS:  Lab Results  Component Value Date   NA 140 03/14/2014   K 3.7 03/14/2014   CL 112 (H) 03/14/2014   CO2 21 03/14/2014   GLUCOSE 96 03/14/2014   BUN 11 03/14/2014   CREATININE 0.80 11/06/2016   CALCIUM 7.9 (L) 03/14/2014   PROT 6.6 03/12/2014   ALBUMIN 3.2 (L) 03/12/2014   AST 19 03/12/2014   ALT 21 03/12/2014   ALKPHOS 77 03/12/2014   BILITOT 0.4 03/12/2014   GFRNONAA >60 03/14/2014   GFRAA >60 03/14/2014    Lab Results  Component Value Date   WBC 7.5 09/24/2018   NEUTROABS 4.0 09/24/2018   HGB 12.6 09/24/2018   HCT 39.0 09/24/2018   MCV 89.4 09/24/2018   PLT 394 09/24/2018   Lab Results  Component Value Date   IRON 55 09/24/2018   TIBC 448 09/24/2018   IRONPCTSAT 12 09/24/2018   Lab Results  Component Value Date   FERRITIN 10 (L) 09/24/2018     STUDIES: No results found.  ASSESSMENT: Iron deficiency anemia.  PLAN:    1.  Iron deficiency anemia: Patient's hemoglobin and iron stores continue to be within normal limits, although her ferritin level is slightly decreased.  Previously, all of her other laboratory work was either negative or within normal limits.  She also reports negative stool cards.  She had a negative colonoscopy several years ago,, we do not have these results.  Patient's most recent infusion of IV Feraheme occurred on March 24, 2018.  She does not require additional Feraheme today.  Return to clinic in 4 months with repeat laboratory work, further evaluation, and consideration of additional treatment. 2.  Thrombocytosis: Resolved.  I spent a total of 20 minutes face-to-face with the patient of which greater than 50% of the visit was spent in counseling and coordination of care as detailed above.   Patient expressed understanding and was in agreement with this plan. She also understands that She can call clinic at any time with any questions, concerns, or complaints.    Lloyd Huger, MD   09/26/2018 8:57 AM

## 2018-09-22 ENCOUNTER — Other Ambulatory Visit: Payer: Self-pay

## 2018-09-22 DIAGNOSIS — D509 Iron deficiency anemia, unspecified: Secondary | ICD-10-CM

## 2018-09-24 ENCOUNTER — Other Ambulatory Visit: Payer: Self-pay

## 2018-09-24 ENCOUNTER — Inpatient Hospital Stay (HOSPITAL_BASED_OUTPATIENT_CLINIC_OR_DEPARTMENT_OTHER): Payer: Medicare Other | Admitting: Oncology

## 2018-09-24 ENCOUNTER — Inpatient Hospital Stay: Payer: Medicare Other | Attending: Oncology

## 2018-09-24 ENCOUNTER — Inpatient Hospital Stay: Payer: Medicare Other

## 2018-09-24 ENCOUNTER — Encounter: Payer: Self-pay | Admitting: Oncology

## 2018-09-24 VITALS — BP 130/81 | HR 85 | Temp 97.7°F | Resp 18 | Wt 217.5 lb

## 2018-09-24 DIAGNOSIS — D509 Iron deficiency anemia, unspecified: Secondary | ICD-10-CM

## 2018-09-24 LAB — CBC WITH DIFFERENTIAL/PLATELET
Abs Immature Granulocytes: 0.02 10*3/uL (ref 0.00–0.07)
Basophils Absolute: 0.1 10*3/uL (ref 0.0–0.1)
Basophils Relative: 1 %
Eosinophils Absolute: 0.2 10*3/uL (ref 0.0–0.5)
Eosinophils Relative: 2 %
HCT: 39 % (ref 36.0–46.0)
Hemoglobin: 12.6 g/dL (ref 12.0–15.0)
Immature Granulocytes: 0 %
Lymphocytes Relative: 33 %
Lymphs Abs: 2.5 10*3/uL (ref 0.7–4.0)
MCH: 28.9 pg (ref 26.0–34.0)
MCHC: 32.3 g/dL (ref 30.0–36.0)
MCV: 89.4 fL (ref 80.0–100.0)
Monocytes Absolute: 0.7 10*3/uL (ref 0.1–1.0)
Monocytes Relative: 9 %
Neutro Abs: 4 10*3/uL (ref 1.7–7.7)
Neutrophils Relative %: 55 %
Platelets: 394 10*3/uL (ref 150–400)
RBC: 4.36 MIL/uL (ref 3.87–5.11)
RDW: 14.1 % (ref 11.5–15.5)
WBC: 7.5 10*3/uL (ref 4.0–10.5)
nRBC: 0 % (ref 0.0–0.2)

## 2018-09-24 LAB — IRON AND TIBC
Iron: 55 ug/dL (ref 28–170)
Saturation Ratios: 12 % (ref 10.4–31.8)
TIBC: 448 ug/dL (ref 250–450)
UIBC: 393 ug/dL

## 2018-09-24 LAB — FERRITIN: Ferritin: 10 ng/mL — ABNORMAL LOW (ref 11–307)

## 2018-09-24 NOTE — Progress Notes (Signed)
No feraheme today per Dr. Gary Fleet checkout note.

## 2018-09-24 NOTE — Progress Notes (Signed)
Patient is here for follow up, she is doing well no complaints.  

## 2018-10-21 DIAGNOSIS — E669 Obesity, unspecified: Secondary | ICD-10-CM | POA: Insufficient documentation

## 2018-11-02 ENCOUNTER — Other Ambulatory Visit: Payer: Self-pay | Admitting: Internal Medicine

## 2018-11-02 DIAGNOSIS — Z1231 Encounter for screening mammogram for malignant neoplasm of breast: Secondary | ICD-10-CM

## 2018-12-06 ENCOUNTER — Ambulatory Visit
Admission: RE | Admit: 2018-12-06 | Discharge: 2018-12-06 | Disposition: A | Discharge status: Home / Self Care | Payer: Medicare Other | Source: Ambulatory Visit | Attending: Internal Medicine | Admitting: Internal Medicine

## 2018-12-06 DIAGNOSIS — Z1231 Encounter for screening mammogram for malignant neoplasm of breast: Secondary | ICD-10-CM | POA: Diagnosis present

## 2019-02-06 NOTE — Progress Notes (Deleted)
[facility]  Telephone:(336) [phone] Fax:(336) [phone]  ID: Julia Klein OB: 07/22/1945  MR#: [id number]  [id number]  Patient Care Team: Idelle Crouch, MD as PCP - General (Internal Medicine)   CHIEF COMPLAINT: Iron deficiency anemia.  INTERVAL HISTORY: Patient returns to clinic today for repeat laboratory work, further evaluation, and consideration of additional IV iron.  She currently feels well and is asymptomatic.  She denies any weakness or fatigue. She has no neurologic complaints.  She denies any recent fevers or illnesses.  She has a good appetite and denies weight loss.  She denies any chest pain, shortness of breath, cough, or hemoptysis.  She denies any nausea, vomiting, constipation, or diarrhea.  She has no melena or hematochezia.  She has no urinary complaints.  Patient feels at her baseline offers no specific complaints today.  REVIEW OF SYSTEMS:   Review of Systems  Constitutional: Negative.  Negative for fever, malaise/fatigue and weight loss.  Respiratory: Negative.  Negative for cough, hemoptysis and shortness of breath.   Cardiovascular: Negative.  Negative for chest pain and leg swelling.  Gastrointestinal: Negative.  Negative for abdominal pain, blood in stool and melena.  Genitourinary: Negative.  Negative for hematuria.  Musculoskeletal: Negative.  Negative for back pain.  Skin: Negative.  Negative for rash.  Neurological: Negative.  Negative for dizziness, focal weakness, weakness and headaches.  Psychiatric/Behavioral: Negative.  The patient is not nervous/anxious.     As per HPI. Otherwise, a complete review of systems is negative.  PAST MEDICAL HISTORY: Past Medical History:  Diagnosis Date  . DVT (deep venous thrombosis) ([place])   . Pulmonary embolism ([place])   . Seizures ([place])     PAST SURGICAL HISTORY: Past Surgical History:  Procedure Laterality Date  . ABDOMINAL HYSTERECTOMY    . SHOULDER SURGERY Left     FAMILY  HISTORY: Family History  Problem Relation Age of Onset  . Breast cancer Paternal Aunt     ADVANCED DIRECTIVES (Y/N):  N  HEALTH MAINTENANCE: Social History   Tobacco Use  . Smoking status: Never Smoker  . Smokeless tobacco: Never Used  Substance Use Topics  . Alcohol use: No    Frequency: Never  . Drug use: No     Colonoscopy:  PAP:  Bone density:  Lipid panel:  Allergies  Allergen Reactions  . Amoxicillin-Pot Clavulanate Diarrhea and Nausea And Vomiting  . Esomeprazole Magnesium Other (See Comments)  . Ivp Dye [Iodinated Diagnostic Agents] Hives  . Gabapentin Rash  . Levofloxacin Rash and Swelling  . Omeprazole Other (See Comments) and Palpitations    Current Outpatient Medications  Medication Sig Dispense Refill  . cyanocobalamin (,VITAMIN B-12,) 1000 MCG/ML injection Inject 1 mg into the muscle 1 day or 1 dose.    . rivaroxaban (XARELTO) 10 MG TABS tablet Take 1 tablet by mouth.    Marland Kitchen tiZANidine (ZANAFLEX) 4 MG tablet Take 1 tablet by mouth 1 day or 1 dose.    . topiramate (TOPAMAX) 50 MG tablet Take 1 tablet by mouth 1 day or 1 dose.    . Vitamin D, Ergocalciferol, (DRISDOL) 1.25 MG (50000 UT) CAPS capsule Take 1 capsule by mouth 1 day or 1 dose.     No current facility-administered medications for this visit.     OBJECTIVE: There were no vitals filed for this visit.   There is no height or weight on file to calculate BMI.    ECOG FS:0 - Asymptomatic  General: Well-developed, well-nourished, no acute distress.  Eyes: Pink conjunctiva, anicteric sclera. HEENT: Normocephalic, moist mucous membranes. Lungs: Clear to auscultation bilaterally. Heart: Regular rate and rhythm. No rubs, murmurs, or gallops. Abdomen: Soft, nontender, nondistended. No organomegaly noted, normoactive bowel sounds. Musculoskeletal: No edema, cyanosis, or clubbing. Neuro: Alert, answering all questions appropriately. Cranial nerves grossly intact. Skin: No rashes or petechiae noted.  Psych: Normal affect.  LAB RESULTS:  Lab Results  Component Value Date   NA 140 03/14/2014   K 3.7 03/14/2014   CL 112 (H) 03/14/2014   CO2 21 03/14/2014   GLUCOSE 96 03/14/2014   BUN 11 03/14/2014   CREATININE 0.80 11/06/2016   CALCIUM 7.9 (L) 03/14/2014   PROT 6.6 03/12/2014   ALBUMIN 3.2 (L) 03/12/2014   AST 19 03/12/2014   ALT 21 03/12/2014   ALKPHOS 77 03/12/2014   BILITOT 0.4 03/12/2014   GFRNONAA >60 03/14/2014   GFRAA >60 03/14/2014    Lab Results  Component Value Date   WBC 7.5 09/24/2018   NEUTROABS 4.0 09/24/2018   HGB 12.6 09/24/2018   HCT 39.0 09/24/2018   MCV 89.4 09/24/2018   PLT 394 09/24/2018   Lab Results  Component Value Date   IRON 55 09/24/2018   TIBC 448 09/24/2018   IRONPCTSAT 12 09/24/2018   Lab Results  Component Value Date   FERRITIN 10 (L) 09/24/2018     STUDIES: No results found.  ASSESSMENT: Iron deficiency anemia.  PLAN:    1.  Iron deficiency anemia: Patient's hemoglobin and iron stores continue to be within normal limits, although her ferritin level is slightly decreased.  Previously, all of her other laboratory work was either negative or within normal limits.  She also reports negative stool cards.  She had a negative colonoscopy several years ago,, we do not have these results.  Patient's most recent infusion of IV Feraheme occurred on March 24, 2018.  She does not require additional Feraheme today.  Return to clinic in 4 months with repeat laboratory work, further evaluation, and consideration of additional treatment. 2.  Thrombocytosis: Resolved.  I spent a total of 20 minutes face-to-face with the patient of which greater than 50% of the visit was spent in counseling and coordination of care as detailed above.   Patient expressed understanding and was in agreement with this plan. She also understands that She can call clinic at any time with any questions, concerns, or complaints.    Lloyd Huger, MD    02/06/2019 7:47 AM

## 2019-02-10 ENCOUNTER — Inpatient Hospital Stay: Payer: Medicare Other

## 2019-02-10 ENCOUNTER — Inpatient Hospital Stay: Payer: Medicare Other | Admitting: Oncology

## 2019-03-04 NOTE — Progress Notes (Signed)
[facility]  Telephone:(336) [phone] Fax:(336) [phone]  ID: Julia Klein OB: 1945-11-15  MR#: [id number]  [id number]  Patient Care Team: Idelle Crouch, MD as PCP - General (Internal Medicine)   CHIEF COMPLAINT: Iron deficiency anemia.  INTERVAL HISTORY: Patient returns to clinic today for repeat laboratory work and further evaluation.  She continues to feel well and remains asymptomatic.  She has spent the past several months traveling on her motorcycle.  She has no neurologic complaints.  She denies any recent fevers or illnesses.  She has a good appetite and denies weight loss.  She denies any chest pain, shortness of breath, cough, or hemoptysis.  She denies any nausea, vomiting, constipation, or diarrhea.  She has no melena or hematochezia.  She has no urinary complaints.  Patient offers no specific complaints today.  REVIEW OF SYSTEMS:   Review of Systems  Constitutional: Negative.  Negative for fever, malaise/fatigue and weight loss.  Respiratory: Negative.  Negative for cough, hemoptysis and shortness of breath.   Cardiovascular: Negative.  Negative for chest pain and leg swelling.  Gastrointestinal: Negative.  Negative for abdominal pain, blood in stool and melena.  Genitourinary: Negative.  Negative for hematuria.  Musculoskeletal: Negative.  Negative for back pain.  Skin: Negative.  Negative for rash.  Neurological: Negative.  Negative for dizziness, focal weakness, weakness and headaches.  Psychiatric/Behavioral: Negative.  The patient is not nervous/anxious.     As per HPI. Otherwise, a complete review of systems is negative.  PAST MEDICAL HISTORY: Past Medical History:  Diagnosis Date  . DVT (deep venous thrombosis) ([place])   . Pulmonary embolism ([place])   . Seizures ([place])     PAST SURGICAL HISTORY: Past Surgical History:  Procedure Laterality Date  . ABDOMINAL HYSTERECTOMY    . SHOULDER SURGERY Left     FAMILY HISTORY: Family  History  Problem Relation Age of Onset  . Breast cancer Paternal Aunt     ADVANCED DIRECTIVES (Y/N):  N  HEALTH MAINTENANCE: Social History   Tobacco Use  . Smoking status: Never Smoker  . Smokeless tobacco: Never Used  Substance Use Topics  . Alcohol use: No  . Drug use: No     Colonoscopy:  PAP:  Bone density:  Lipid panel:  Allergies  Allergen Reactions  . Amoxicillin-Pot Clavulanate Diarrhea and Nausea And Vomiting  . Esomeprazole Magnesium Other (See Comments)  . Ivp Dye [Iodinated Diagnostic Agents] Hives  . Gabapentin Rash  . Levofloxacin Rash and Swelling  . Omeprazole Other (See Comments) and Palpitations    Current Outpatient Medications  Medication Sig Dispense Refill  . cyanocobalamin (,VITAMIN B-12,) 1000 MCG/ML injection Inject 1 mg into the muscle 1 day or 1 dose.    . rivaroxaban (XARELTO) 10 MG TABS tablet Take 1 tablet by mouth.    Marland Kitchen tiZANidine (ZANAFLEX) 4 MG tablet Take 1 tablet by mouth 1 day or 1 dose.    . topiramate (TOPAMAX) 50 MG tablet Take 1 tablet by mouth 1 day or 1 dose.    . Vitamin D, Ergocalciferol, (DRISDOL) 1.25 MG (50000 UT) CAPS capsule Take 1 capsule by mouth 1 day or 1 dose.     No current facility-administered medications for this visit.    OBJECTIVE: There were no vitals filed for this visit.   There is no height or weight on file to calculate BMI.    ECOG FS:0 - Asymptomatic  General: Well-developed, well-nourished, no acute distress. Eyes: Pink conjunctiva, anicteric sclera. HEENT: Normocephalic, moist  mucous membranes. Lungs: No audible wheezing or coughing. Heart: Regular rate and rhythm. Abdomen: Soft, nontender, no obvious distention. Musculoskeletal: No edema, cyanosis, or clubbing. Neuro: Alert, answering all questions appropriately. Cranial nerves grossly intact. Skin: No rashes or petechiae noted. Psych: Normal affect.   LAB RESULTS:  Lab Results  Component Value Date   NA 140 03/14/2014   K 3.7  03/14/2014   CL 112 (H) 03/14/2014   CO2 21 03/14/2014   GLUCOSE 96 03/14/2014   BUN 11 03/14/2014   CREATININE 0.80 11/06/2016   CALCIUM 7.9 (L) 03/14/2014   PROT 6.6 03/12/2014   ALBUMIN 3.2 (L) 03/12/2014   AST 19 03/12/2014   ALT 21 03/12/2014   ALKPHOS 77 03/12/2014   BILITOT 0.4 03/12/2014   GFRNONAA >60 03/14/2014   GFRAA >60 03/14/2014    Lab Results  Component Value Date   WBC 6.5 03/10/2019   NEUTROABS 3.6 03/10/2019   HGB 13.3 03/10/2019   HCT 40.9 03/10/2019   MCV 96.5 03/10/2019   PLT 357 03/10/2019   Lab Results  Component Value Date   IRON 74 03/10/2019   TIBC 385 03/10/2019   IRONPCTSAT 19 03/10/2019   Lab Results  Component Value Date   FERRITIN 22 03/10/2019     STUDIES: No results found.  ASSESSMENT: Iron deficiency anemia.  PLAN:    1.  Iron deficiency anemia: Patient's hemoglobin and iron stores continue to be within normal limits.  Ferritin level has trended up since her last laboratory work.  Previously, all of her other laboratory work was either negative or within normal limits.  She reports a negative colonoscopy several years ago, but we do not have these results.  Patient's most recent infusion of IV Feraheme occurred on March 24, 2018.  She does not require additional IV iron today.  Return to clinic in 6 months for repeat laboratory work and further evaluation.  If her laboratory work continues to be within normal limits, she likely can be discharged from clinic.   I spent a total of 15 minutes face-to-face with the patient and reviewing chart data of which greater than 50% of the visit was spent in counseling and coordination of care as detailed above.   Patient expressed understanding and was in agreement with this plan. She also understands that She can call clinic at any time with any questions, concerns, or complaints.    Lloyd Huger, MD   03/10/2019 5:26 PM

## 2019-03-09 ENCOUNTER — Encounter: Payer: Self-pay | Admitting: Oncology

## 2019-03-09 DIAGNOSIS — G8929 Other chronic pain: Secondary | ICD-10-CM | POA: Insufficient documentation

## 2019-03-10 ENCOUNTER — Inpatient Hospital Stay: Payer: Medicare Other

## 2019-03-10 ENCOUNTER — Inpatient Hospital Stay: Payer: Medicare Other | Attending: Oncology | Admitting: Oncology

## 2019-03-10 ENCOUNTER — Other Ambulatory Visit: Payer: Self-pay

## 2019-03-10 DIAGNOSIS — D509 Iron deficiency anemia, unspecified: Secondary | ICD-10-CM | POA: Diagnosis present

## 2019-03-10 DIAGNOSIS — Z803 Family history of malignant neoplasm of breast: Secondary | ICD-10-CM | POA: Diagnosis not present

## 2019-03-10 DIAGNOSIS — Z90710 Acquired absence of both cervix and uterus: Secondary | ICD-10-CM | POA: Insufficient documentation

## 2019-03-10 DIAGNOSIS — Z86718 Personal history of other venous thrombosis and embolism: Secondary | ICD-10-CM | POA: Insufficient documentation

## 2019-03-10 LAB — CBC WITH DIFFERENTIAL/PLATELET
Abs Immature Granulocytes: 0.01 10*3/uL (ref 0.00–0.07)
Basophils Absolute: 0.1 10*3/uL (ref 0.0–0.1)
Basophils Relative: 1 %
Eosinophils Absolute: 0.1 10*3/uL (ref 0.0–0.5)
Eosinophils Relative: 2 %
HCT: 40.9 % (ref 36.0–46.0)
Hemoglobin: 13.3 g/dL (ref 12.0–15.0)
Immature Granulocytes: 0 %
Lymphocytes Relative: 32 %
Lymphs Abs: 2.1 10*3/uL (ref 0.7–4.0)
MCH: 31.4 pg (ref 26.0–34.0)
MCHC: 32.5 g/dL (ref 30.0–36.0)
MCV: 96.5 fL (ref 80.0–100.0)
Monocytes Absolute: 0.7 10*3/uL (ref 0.1–1.0)
Monocytes Relative: 10 %
Neutro Abs: 3.6 10*3/uL (ref 1.7–7.7)
Neutrophils Relative %: 55 %
Platelets: 357 10*3/uL (ref 150–400)
RBC: 4.24 MIL/uL (ref 3.87–5.11)
RDW: 14.2 % (ref 11.5–15.5)
WBC: 6.5 10*3/uL (ref 4.0–10.5)
nRBC: 0 % (ref 0.0–0.2)

## 2019-03-10 LAB — IRON AND TIBC
Iron: 74 ug/dL (ref 28–170)
Saturation Ratios: 19 % (ref 10.4–31.8)
TIBC: 385 ug/dL (ref 250–450)
UIBC: 311 ug/dL

## 2019-03-10 LAB — FERRITIN: Ferritin: 22 ng/mL (ref 11–307)

## 2019-09-12 ENCOUNTER — Ambulatory Visit: Payer: Medicare Other | Admitting: Oncology

## 2019-09-12 ENCOUNTER — Ambulatory Visit: Payer: Medicare Other

## 2019-09-12 ENCOUNTER — Other Ambulatory Visit: Payer: Medicare Other

## 2019-09-13 ENCOUNTER — Ambulatory Visit: Payer: Medicare Other | Admitting: Oncology

## 2019-09-13 ENCOUNTER — Ambulatory Visit: Payer: Medicare Other

## 2019-09-30 ENCOUNTER — Other Ambulatory Visit: Payer: Self-pay

## 2019-09-30 DIAGNOSIS — D509 Iron deficiency anemia, unspecified: Secondary | ICD-10-CM

## 2019-10-02 NOTE — Progress Notes (Signed)
[facility]  Telephone:(336) [phone] Fax:(336) [phone]  ID: Lonie Peak OB: 1945-06-18  MR#: [id number]  GUR#:[id number]  Patient Care Team: Idelle Crouch, MD as PCP - General (Internal Medicine)  I connected with Lonie Peak on 10/05/19 at  1:00 PM EDT by video enabled telemedicine visit and verified that I am speaking with the correct person using two identifiers.   I discussed the limitations, risks, security and privacy concerns of performing an evaluation and management service by telemedicine and the availability of in-person appointments. I also discussed with the patient that there may be a patient responsible charge related to this service. The patient expressed understanding and agreed to proceed.   Other persons participating in the visit and their role in the encounter: Patient, MD.  Patients location: Home. Providers location: Clinic.  CHIEF COMPLAINT: Iron deficiency anemia.  INTERVAL HISTORY: Patient agreed to video enabled telemedicine visit for further evaluation and discussion of her laboratory results.  She continues to feel well and remains asymptomatic.  She continues to extensively travel on her motorcycle. She has no neurologic complaints.  She denies any recent fevers or illnesses.  She has a good appetite and denies weight loss.  She denies any chest pain, shortness of breath, cough, or hemoptysis.  She denies any nausea, vomiting, constipation, or diarrhea.  She has no melena or hematochezia.  She has no urinary complaints.  Patient offers no specific complaints today.  REVIEW OF SYSTEMS:   Review of Systems  Constitutional: Negative.  Negative for fever, malaise/fatigue and weight loss.  Respiratory: Negative.  Negative for cough, hemoptysis and shortness of breath.   Cardiovascular: Negative.  Negative for chest pain and leg swelling.  Gastrointestinal: Negative.  Negative for abdominal pain, blood in stool and melena.    Genitourinary: Negative.  Negative for hematuria.  Musculoskeletal: Negative.  Negative for back pain.  Skin: Negative.  Negative for rash.  Neurological: Negative.  Negative for dizziness, focal weakness, weakness and headaches.  Psychiatric/Behavioral: Negative.  The patient is not nervous/anxious.     As per HPI. Otherwise, a complete review of systems is negative.  PAST MEDICAL HISTORY: Past Medical History:  Diagnosis Date   DVT (deep venous thrombosis) ([facility])    Pulmonary embolism ([facility])    Seizures ([facility])     PAST SURGICAL HISTORY: Past Surgical History:  Procedure Laterality Date   ABDOMINAL HYSTERECTOMY     SHOULDER SURGERY Left     FAMILY HISTORY: Family History  Problem Relation Age of Onset   Breast cancer Paternal Aunt     ADVANCED DIRECTIVES (Y/N):  N  HEALTH MAINTENANCE: Social History   Tobacco Use   Smoking status: Never Smoker   Smokeless tobacco: Never Used  Substance Use Topics   Alcohol use: No   Drug use: No     Colonoscopy:  PAP:  Bone density:  Lipid panel:  Allergies  Allergen Reactions   Amoxicillin-Pot Clavulanate Diarrhea and Nausea And Vomiting   Esomeprazole Magnesium Other (See Comments)   Ivp Dye [Iodinated Diagnostic Agents] Hives   Gabapentin Rash   Levofloxacin Rash and Swelling   Omeprazole Other (See Comments) and Palpitations    Current Outpatient Medications  Medication Sig Dispense Refill   cyanocobalamin (,VITAMIN B-12,) 1000 MCG/ML injection Inject 1 mg into the muscle 1 day or 1 dose.     Cyanocobalamin (B-12-SL) 1000 MCG SUBL Place under the tongue.     ferrous sulfate (SLOW IRON) 160 (50 Fe) MG TBCR SR tablet Take  160 mg by mouth daily.     pantoprazole (PROTONIX) 40 MG tablet Take by mouth.     rivaroxaban (XARELTO) 10 MG TABS tablet Take 1 tablet by mouth.     topiramate (TOPAMAX) 50 MG tablet Take 1 tablet by mouth 1 day or 1 dose.     Vitamin D, Ergocalciferol, (DRISDOL) 1.25 MG  (50000 UT) CAPS capsule Take 1 capsule by mouth 1 day or 1 dose.     pravastatin (PRAVACHOL) 20 MG tablet Take by mouth. (Patient not taking: Reported on 10/04/2019)     No current facility-administered medications for this visit.    OBJECTIVE: There were no vitals filed for this visit.   There is no height or weight on file to calculate BMI.    ECOG FS:0 - Asymptomatic  General: Well-developed, well-nourished, no acute distress. Eyes: Pink conjunctiva, anicteric sclera. HEENT: Normocephalic, moist mucous membranes. Lungs: No audible wheezing or coughing. Heart: Regular rate and rhythm. Abdomen: Soft, nontender, no obvious distention. Musculoskeletal: No edema, cyanosis, or clubbing. Neuro: Alert, answering all questions appropriately. Cranial nerves grossly intact. Skin: No rashes or petechiae noted. Psych: Normal affect.  LAB RESULTS:  Lab Results  Component Value Date   NA 140 03/14/2014   K 3.7 03/14/2014   CL 112 (H) 03/14/2014   CO2 21 03/14/2014   GLUCOSE 96 03/14/2014   BUN 11 03/14/2014   CREATININE 0.80 11/06/2016   CALCIUM 7.9 (L) 03/14/2014   PROT 6.6 03/12/2014   ALBUMIN 3.2 (L) 03/12/2014   AST 19 03/12/2014   ALT 21 03/12/2014   ALKPHOS 77 03/12/2014   BILITOT 0.4 03/12/2014   GFRNONAA >60 03/14/2014   GFRAA >60 03/14/2014    Lab Results  Component Value Date   WBC 6.0 10/03/2019   NEUTROABS 2.9 10/03/2019   HGB 13.5 10/03/2019   HCT 38.9 10/03/2019   MCV 91.7 10/03/2019   PLT 348 10/03/2019   Lab Results  Component Value Date   IRON 95 10/03/2019   TIBC 344 10/03/2019   IRONPCTSAT 28 10/03/2019   Lab Results  Component Value Date   FERRITIN 38 10/03/2019     STUDIES: No results found.  ASSESSMENT: Iron deficiency anemia.  PLAN:    1.  Iron deficiency anemia: Patient's hemoglobin and iron stores continue to be within normal limits. Previously, all of her other laboratory work was either negative or within normal limits.  She reports  a negative colonoscopy several years ago, but we do not have these results.  Patient's most recent infusion of IV Feraheme occurred on March 24, 2018.  She does not require additional treatment today.  No further follow-up is necessary.  Please refer patient back if there are any questions or concerns.  I provided 20 minutes of face-to-face video visit time during this encounter which included chart review, counseling, and coordination of care as documented above.   Patient expressed understanding and was in agreement with this plan. She also understands that She can call clinic at any time with any questions, concerns, or complaints.    Lloyd Huger, MD   10/05/2019 1:39 PM

## 2019-10-03 ENCOUNTER — Encounter: Payer: Self-pay | Admitting: Oncology

## 2019-10-03 ENCOUNTER — Other Ambulatory Visit: Payer: Self-pay

## 2019-10-03 ENCOUNTER — Inpatient Hospital Stay: Payer: Medicare Other | Attending: Oncology

## 2019-10-03 DIAGNOSIS — D509 Iron deficiency anemia, unspecified: Secondary | ICD-10-CM

## 2019-10-03 LAB — IRON AND TIBC
Iron: 95 ug/dL (ref 28–170)
Saturation Ratios: 28 % (ref 10.4–31.8)
TIBC: 344 ug/dL (ref 250–450)
UIBC: 249 ug/dL

## 2019-10-03 LAB — FERRITIN: Ferritin: 38 ng/mL (ref 11–307)

## 2019-10-03 LAB — CBC WITH DIFFERENTIAL/PLATELET
Abs Immature Granulocytes: 0.01 10*3/uL (ref 0.00–0.07)
Basophils Absolute: 0.1 10*3/uL (ref 0.0–0.1)
Basophils Relative: 1 %
Eosinophils Absolute: 0.2 10*3/uL (ref 0.0–0.5)
Eosinophils Relative: 3 %
HCT: 38.9 % (ref 36.0–46.0)
Hemoglobin: 13.5 g/dL (ref 12.0–15.0)
Immature Granulocytes: 0 %
Lymphocytes Relative: 37 %
Lymphs Abs: 2.2 10*3/uL (ref 0.7–4.0)
MCH: 31.8 pg (ref 26.0–34.0)
MCHC: 34.7 g/dL (ref 30.0–36.0)
MCV: 91.7 fL (ref 80.0–100.0)
Monocytes Absolute: 0.6 10*3/uL (ref 0.1–1.0)
Monocytes Relative: 10 %
Neutro Abs: 2.9 10*3/uL (ref 1.7–7.7)
Neutrophils Relative %: 49 %
Platelets: 348 10*3/uL (ref 150–400)
RBC: 4.24 MIL/uL (ref 3.87–5.11)
RDW: 14.4 % (ref 11.5–15.5)
WBC: 6 10*3/uL (ref 4.0–10.5)
nRBC: 0 % (ref 0.0–0.2)

## 2019-10-04 ENCOUNTER — Inpatient Hospital Stay: Payer: Medicare Other

## 2019-10-04 ENCOUNTER — Inpatient Hospital Stay (HOSPITAL_BASED_OUTPATIENT_CLINIC_OR_DEPARTMENT_OTHER): Payer: Medicare Other | Admitting: Oncology

## 2019-10-04 DIAGNOSIS — D509 Iron deficiency anemia, unspecified: Secondary | ICD-10-CM | POA: Diagnosis not present

## 2019-10-04 NOTE — Progress Notes (Signed)
Patient denies any concerns today.  

## 2019-10-28 ENCOUNTER — Other Ambulatory Visit: Payer: Self-pay | Admitting: Internal Medicine

## 2019-10-28 DIAGNOSIS — Z1231 Encounter for screening mammogram for malignant neoplasm of breast: Secondary | ICD-10-CM

## 2019-12-07 ENCOUNTER — Ambulatory Visit
Admission: RE | Admit: 2019-12-07 | Discharge: 2019-12-07 | Disposition: A | Discharge status: Home / Self Care | Payer: Medicare Other | Source: Ambulatory Visit | Attending: Internal Medicine | Admitting: Internal Medicine

## 2019-12-07 ENCOUNTER — Other Ambulatory Visit: Payer: Self-pay

## 2019-12-07 DIAGNOSIS — Z1231 Encounter for screening mammogram for malignant neoplasm of breast: Secondary | ICD-10-CM | POA: Diagnosis present

## 2020-03-28 ENCOUNTER — Other Ambulatory Visit: Payer: Self-pay

## 2020-03-28 ENCOUNTER — Other Ambulatory Visit
Admission: RE | Admit: 2020-03-28 | Discharge: 2020-03-28 | Disposition: A | Discharge status: Home / Self Care | Payer: Medicare Other | Source: Ambulatory Visit | Attending: Gastroenterology | Admitting: Gastroenterology

## 2020-03-28 DIAGNOSIS — Z20822 Contact with and (suspected) exposure to covid-19: Secondary | ICD-10-CM | POA: Diagnosis not present

## 2020-03-28 DIAGNOSIS — Z01812 Encounter for preprocedural laboratory examination: Secondary | ICD-10-CM | POA: Insufficient documentation

## 2020-03-28 LAB — SARS CORONAVIRUS 2 (TAT 6-24 HRS): SARS Coronavirus 2: NEGATIVE

## 2020-03-29 ENCOUNTER — Encounter: Payer: Self-pay | Admitting: *Deleted

## 2020-03-30 ENCOUNTER — Ambulatory Visit
Admission: RE | Admit: 2020-03-30 | Discharge: 2020-03-30 | Disposition: A | Discharge status: Home / Self Care | Payer: Medicare Other | Attending: Gastroenterology | Admitting: Gastroenterology

## 2020-03-30 ENCOUNTER — Ambulatory Visit: Payer: Medicare Other | Admitting: Certified Registered Nurse Anesthetist

## 2020-03-30 ENCOUNTER — Other Ambulatory Visit: Payer: Self-pay

## 2020-03-30 ENCOUNTER — Encounter: Payer: Self-pay | Admitting: *Deleted

## 2020-03-30 ENCOUNTER — Encounter
Admission: RE | Disposition: A | Discharge status: Home / Self Care | Payer: Self-pay | Source: Home / Self Care | Attending: Gastroenterology

## 2020-03-30 DIAGNOSIS — Z86711 Personal history of pulmonary embolism: Secondary | ICD-10-CM | POA: Insufficient documentation

## 2020-03-30 DIAGNOSIS — K449 Diaphragmatic hernia without obstruction or gangrene: Secondary | ICD-10-CM | POA: Insufficient documentation

## 2020-03-30 DIAGNOSIS — K2270 Barrett's esophagus without dysplasia: Secondary | ICD-10-CM | POA: Diagnosis not present

## 2020-03-30 DIAGNOSIS — Z86718 Personal history of other venous thrombosis and embolism: Secondary | ICD-10-CM | POA: Insufficient documentation

## 2020-03-30 DIAGNOSIS — Z7901 Long term (current) use of anticoagulants: Secondary | ICD-10-CM | POA: Diagnosis not present

## 2020-03-30 DIAGNOSIS — Z79899 Other long term (current) drug therapy: Secondary | ICD-10-CM | POA: Diagnosis not present

## 2020-03-30 DIAGNOSIS — K2100 Gastro-esophageal reflux disease with esophagitis, without bleeding: Secondary | ICD-10-CM | POA: Insufficient documentation

## 2020-03-30 HISTORY — DX: Cardiac murmur, unspecified: R01.1

## 2020-03-30 HISTORY — PX: ESOPHAGOGASTRODUODENOSCOPY (EGD) WITH PROPOFOL: SHX5813

## 2020-03-30 HISTORY — DX: Gastro-esophageal reflux disease without esophagitis: K21.9

## 2020-03-30 HISTORY — DX: Atherosclerotic heart disease of native coronary artery without angina pectoris: I25.10

## 2020-03-30 HISTORY — DX: Anemia, unspecified: D64.9

## 2020-03-30 HISTORY — DX: Other specified disorders of kidney and ureter: N28.89

## 2020-03-30 HISTORY — DX: Other cervical disc degeneration, unspecified cervical region: M50.30

## 2020-03-30 HISTORY — DX: Occlusion and stenosis of unspecified carotid artery: I65.29

## 2020-03-30 HISTORY — DX: Headache, unspecified: R51.9

## 2020-03-30 HISTORY — DX: Essential (primary) hypertension: I10

## 2020-03-30 SURGERY — ESOPHAGOGASTRODUODENOSCOPY (EGD) WITH PROPOFOL
Anesthesia: General

## 2020-03-30 MED ORDER — LIDOCAINE HCL (CARDIAC) PF 100 MG/5ML IV SOSY
PREFILLED_SYRINGE | INTRAVENOUS | Status: DC | PRN
  Administered 2020-03-30: 50 mg via INTRAVENOUS

## 2020-03-30 MED ORDER — LIDOCAINE HCL (PF) 2 % IJ SOLN
INTRAMUSCULAR | Status: AC
  Filled 2020-03-30: qty 5

## 2020-03-30 MED ORDER — PROPOFOL 500 MG/50ML IV EMUL
INTRAVENOUS | Status: AC
  Filled 2020-03-30: qty 50

## 2020-03-30 MED ORDER — PROPOFOL 500 MG/50ML IV EMUL
INTRAVENOUS | Status: DC | PRN
  Administered 2020-03-30: 150 ug/kg/min via INTRAVENOUS

## 2020-03-30 MED ORDER — SODIUM CHLORIDE 0.9 % IV SOLN: INTRAVENOUS | Status: DC

## 2020-03-30 MED ORDER — PROPOFOL 10 MG/ML IV BOLUS
INTRAVENOUS | Status: DC | PRN
  Administered 2020-03-30: 20 mg via INTRAVENOUS
  Administered 2020-03-30: 60 mg via INTRAVENOUS
  Administered 2020-03-30: 20 mg via INTRAVENOUS

## 2020-03-30 NOTE — Transfer of Care (Signed)
Immediate Anesthesia Transfer of Care Note  Patient: Julia Klein  Procedure(s) Performed: ESOPHAGOGASTRODUODENOSCOPY (EGD) WITH PROPOFOL (N/A )  Patient Location: PACU  Anesthesia Type:General  Level of Consciousness: drowsy  Airway & Oxygen Therapy: Patient Spontanous Breathing  Post-op Assessment: Report given to RN and Post -op Vital signs reviewed and stable  Post vital signs: Reviewed and stable  Last Vitals:  Vitals Value Taken Time  BP    Temp    Pulse 64 03/30/20 0906  Resp 14 03/30/20 0906  SpO2 95 % 03/30/20 0906  Vitals shown include unvalidated device data.  Last Pain:  Vitals:   03/30/20 0816  TempSrc: Temporal  PainSc: 0-No pain         Complications: No complications documented.

## 2020-03-30 NOTE — H&P (Signed)
Outpatient short stay form Pre-procedure 03/30/2020 8:46 AM Julia Miyamoto MD, MPH  Primary Physician: Dr. Doy Hutching  Reason for visit:  Hx of esophagitis  History of present illness:   75 y/o lady with history of grade d esophagitis here to assess healing. Takes NOAC with last dose being three days ago. No family history of GI malignancies. GERD controlled with PPI.    Current Facility-Administered Medications:  .  0.9 %  sodium chloride infusion, , Intravenous, Continuous, Jaivyn Gulla, Hilton Cork, MD, Last Rate: 20 mL/hr at 03/30/20 0842, Continued from Pre-op at 03/30/20 0842  Medications Prior to Admission  Medication Sig Dispense Refill Last Dose  . ezetimibe (ZETIA) 10 MG tablet Take 10 mg by mouth daily.   03/29/2020 at Unknown time  . famotidine (PEPCID) 40 MG tablet Take 40 mg by mouth at bedtime. As needed for heartburn   Past Week at Unknown time  . ferrous sulfate (SLOW IRON) 160 (50 Fe) MG TBCR SR tablet Take 160 mg by mouth daily.   03/29/2020 at Unknown time  . magnesium oxide (MAG-OX) 400 MG tablet Take 400 mg by mouth daily.   03/29/2020 at Unknown time  . pantoprazole (PROTONIX) 40 MG tablet Take by mouth.   03/29/2020 at Unknown time  . topiramate (TOPAMAX) 50 MG tablet Take 1 tablet by mouth 1 day or 1 dose.   03/29/2020 at Unknown time  . Vitamin D, Ergocalciferol, (DRISDOL) 1.25 MG (50000 UT) CAPS capsule Take 1 capsule by mouth 1 day or 1 dose.   Past Week at Unknown time  . cyanocobalamin (,VITAMIN B-12,) 1000 MCG/ML injection Inject 1 mg into the muscle 1 day or 1 dose.     . Cyanocobalamin (B-12-SL) 1000 MCG SUBL Place under the tongue.     . pravastatin (PRAVACHOL) 20 MG tablet Take by mouth.     . rivaroxaban (XARELTO) 10 MG TABS tablet Take 1 tablet by mouth.   03/25/2020     Allergies  Allergen Reactions  . Amoxicillin-Pot Clavulanate Diarrhea and Nausea And Vomiting  . Esomeprazole Magnesium Other (See Comments)  . Ivp Dye [Iodinated Diagnostic Agents] Hives  .  Gabapentin Rash  . Levofloxacin Rash and Swelling  . Omeprazole Other (See Comments) and Palpitations     Past Medical History:  Diagnosis Date  . Anemia   . Carotid artery stenosis    bilateral  . Coronary artery disease   . Degenerative disc disease, cervical   . DVT (deep venous thrombosis) ([place])   . GERD (gastroesophageal reflux disease)   . Headache   . Heart murmur   . Hypertension   . Pulmonary embolism ([place])   . Renal mass   . Seizures ([place])     Review of systems:  Otherwise negative.    Physical Exam  Gen: Alert, oriented. Appears stated age.  HEENT: PERRLA. Lungs: No respiratory distress CV: RRR Abd: soft, benign, no masses Ext: No edema    Planned procedures: Proceed with EGD. The patient understands the nature of the planned procedure, indications, risks, alternatives and potential complications including but not limited to bleeding, infection, perforation, damage to internal organs and possible oversedation/side effects from anesthesia. The patient agrees and gives consent to proceed.  Please refer to procedure notes for findings, recommendations and patient disposition/instructions.     Julia Miyamoto MD, MPH Gastroenterology 03/30/2020  8:46 AM

## 2020-03-30 NOTE — Anesthesia Postprocedure Evaluation (Signed)
Anesthesia Post Note  Patient: Jeni Duling  Procedure(s) Performed: ESOPHAGOGASTRODUODENOSCOPY (EGD) WITH PROPOFOL (N/A )  Patient location during evaluation: Endoscopy Anesthesia Type: General Level of consciousness: awake and alert Pain management: pain level controlled Vital Signs Assessment: post-procedure vital signs reviewed and stable Respiratory status: spontaneous breathing, nonlabored ventilation, respiratory function stable and patient connected to nasal cannula oxygen Cardiovascular status: blood pressure returned to baseline and stable Postop Assessment: no apparent nausea or vomiting Anesthetic complications: no   No complications documented.   Last Vitals:  Vitals:   03/30/20 0930 03/30/20 0940  BP: (!) 153/88 (!) 128/98  Pulse: 66 60  Resp: 19 17  Temp:    SpO2: 95% 100%    Last Pain:  Vitals:   03/30/20 0906  TempSrc: Temporal  PainSc:                  Martha Clan

## 2020-03-30 NOTE — Op Note (Signed)
[facility] Gastroenterology Patient Name: Julia Klein Procedure Date: 03/30/2020 8:35 AM MRN: [id number] Account #: [id number] Date of Birth: 27-Feb-1946 Admit Type: Outpatient Age: 75 Room: [facility] ENDO ROOM 3 Gender: Female Note Status: Finalized Procedure:             Upper GI endoscopy Indications:           Barrett's esophagus, Follow-up of reflux esophagitis Providers:             Andrey Farmer MD, MD Medicines:             Monitored Anesthesia Care Complications:         No immediate complications. Estimated blood loss:                         Minimal. Procedure:             Pre-Anesthesia Assessment:                        - Prior to the procedure, a History and Physical was                         performed, and patient medications and allergies were                         reviewed. The patient is competent. The risks and                         benefits of the procedure and the sedation options and                         risks were discussed with the patient. All questions                         were answered and informed consent was obtained.                         Patient identification and proposed procedure were                         verified by the physician, the nurse, the anesthetist                         and the technician in the endoscopy suite. Mental                         Status Examination: alert and oriented. Airway                         Examination: normal oropharyngeal airway and neck                         mobility. Respiratory Examination: clear to                         auscultation. CV Examination: normal. Prophylactic                         Antibiotics: The patient does not require prophylactic  antibiotics. Prior Anticoagulants: The patient has                         taken Xarelto (rivaroxaban), last dose was 3 days                         prior to procedure. ASA Grade Assessment: II - A                          patient with mild systemic disease. After reviewing                         the risks and benefits, the patient was deemed in                         satisfactory condition to undergo the procedure. The                         anesthesia plan was to use monitored anesthesia care                         (MAC). Immediately prior to administration of                         medications, the patient was re-assessed for adequacy                         to receive sedatives. The heart rate, respiratory                         rate, oxygen saturations, blood pressure, adequacy of                         pulmonary ventilation, and response to care were                         monitored throughout the procedure. The physical                         status of the patient was re-assessed after the                         procedure.                        After obtaining informed consent, the endoscope was                         passed under direct vision. Throughout the procedure,                         the patient's blood pressure, pulse, and oxygen                         saturations were monitored continuously. The Endoscope                         was introduced through the mouth, and advanced to the  second part of duodenum. The upper GI endoscopy was                         accomplished without difficulty. The patient tolerated                         the procedure well. Findings:      The esophagus and gastroesophageal junction were examined with white       light and narrow band imaging (NBI). There were esophageal mucosal       changes consistent with long-segment Barrett's esophagus, classified as       Barrett's stage C4-M4 per Prague criteria. These changes involved the       mucosa at the upper extent of the gastric folds (32 cm from the       incisors) extending to the Z-line (28 cm from the incisors).       Circumferential salmon-colored  mucosa was present at 28 cm and hiatal       narrowing was identified at 37 cm. The maximum longitudinal extent of       these esophageal mucosal changes was 4 cm in length. Mucosa was biopsied       with a cold forceps for histology in 4 quadrants at intervals of 2 cm. A       total of 3 specimen bottles were sent to pathology. Estimated blood loss       was minimal.      A 5 cm hiatal hernia was present.      The entire examined stomach was normal.      The examined duodenum was normal. Impression:            - Esophageal mucosal changes consistent with                         long-segment Barrett's esophagus, classified as                         Barrett's stage C4-M4 per Prague criteria. Biopsied.                        - 5 cm hiatal hernia.                        - Normal stomach.                        - Normal examined duodenum. Recommendation:        - Discharge patient to home.                        - Resume previous diet.                        - Resume Xarelto (rivaroxaban) at prior dose tomorrow.                        - Await pathology results.                        - Repeat upper endoscopy for surveillance based on  pathology results.                        - Return to referring physician as previously                         scheduled. Procedure Code(s):     --- Professional ---                        [id number], Esophagogastroduodenoscopy, flexible,                         transoral; with biopsy, single or multiple Diagnosis Code(s):     --- Professional ---                        K22.70, Barrett's esophagus without dysplasia                        K44.9, Diaphragmatic hernia without obstruction or                         gangrene                        K21.00, Gastro-esophageal reflux disease with                         esophagitis, without bleeding CPT copyright 2019 American Medical Association. All rights reserved. The codes documented in this  report are preliminary and upon coder review may  be revised to meet current compliance requirements. Andrey Farmer MD, MD 03/30/2020 9:09:45 AM Number of Addenda: 0 Note Initiated On: 03/30/2020 8:35 AM Estimated Blood Loss:  Estimated blood loss was minimal.      [facility]

## 2020-03-30 NOTE — Anesthesia Preprocedure Evaluation (Signed)
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:   Pulmonary neg shortness of breath, neg COPD, Recent URI  (about 3 weeks ago), Resolved,  H/o PE in 2012, no problems since   Pulmonary exam normal breath sounds clear to auscultation       Cardiovascular Exercise Tolerance: Good hypertension, (-) angina+ CAD and + DVT  (-) Past MI and (-) Cardiac Stents Normal cardiovascular exam(-) dysrhythmias + Valvular Problems/Murmurs  Rhythm:regular Rate:Normal     Neuro/Psych  Headaches, Seizures -, Well Controlled,  negative psych ROS   GI/Hepatic Neg liver ROS, GERD  ,  Endo/Other  negative endocrine ROS  Renal/GU negative Renal ROS  negative genitourinary   Musculoskeletal   Abdominal   Peds  Hematology negative hematology ROS (+)   Anesthesia Other Findings Past Medical History: No date: Anemia No date: Carotid artery stenosis     Comment:  bilateral No date: Coronary artery disease No date: Degenerative disc disease, cervical No date: DVT (deep venous thrombosis) ([facility]) No date: GERD (gastroesophageal reflux disease) No date: Headache No date: Heart murmur No date: Hypertension No date: Pulmonary embolism ([facility]) No date: Renal mass No date: Seizures ([place])   Reproductive/Obstetrics negative OB ROS                             Anesthesia Physical Anesthesia Plan  ASA: II  Anesthesia Plan: General   Post-op Pain Management:    Induction: Intravenous  PONV Risk Score and Plan:  3 and TIVA and Propofol infusion  Airway Management Planned: Natural Airway and Nasal Cannula  Additional Equipment:   Intra-op Plan:   Post-operative Plan:   Informed Consent: I have reviewed the patients History and Physical, chart, labs and discussed the procedure including the risks, benefits and alternatives for the proposed anesthesia with the patient or authorized representative who has indicated his/her understanding and acceptance.     Dental Advisory Given  Plan Discussed with: Anesthesiologist, CRNA and Surgeon  Anesthesia Plan Comments:         Anesthesia Quick Evaluation

## 2020-03-30 NOTE — Interval H&P Note (Signed)
History and Physical Interval Note:  03/30/2020 8:48 AM  Julia Klein  has presented today for surgery, with the diagnosis of DYSPEPSIA ESOPHAGITIS.  The various methods of treatment have been discussed with the patient and family. After consideration of risks, benefits and other options for treatment, the patient has consented to  Procedure(s): ESOPHAGOGASTRODUODENOSCOPY (EGD) WITH PROPOFOL (N/A) as a surgical intervention.  The patient's history has been reviewed, patient examined, no change in status, stable for surgery.  I have reviewed the patient's chart and labs.  Questions were answered to the patient's satisfaction.     Lesly Rubenstein  Ok to proceed with EGD

## 2020-04-02 ENCOUNTER — Encounter: Payer: Self-pay | Admitting: Gastroenterology

## 2020-04-02 LAB — SURGICAL PATHOLOGY

## 2020-09-24 ENCOUNTER — Other Ambulatory Visit: Payer: Self-pay | Admitting: Internal Medicine

## 2020-09-24 DIAGNOSIS — Z1231 Encounter for screening mammogram for malignant neoplasm of breast: Secondary | ICD-10-CM

## 2020-12-20 ENCOUNTER — Ambulatory Visit
Admission: RE | Admit: 2020-12-20 | Discharge: 2020-12-20 | Disposition: A | Discharge status: Home / Self Care | Payer: Medicare Other | Source: Ambulatory Visit | Attending: Internal Medicine | Admitting: Internal Medicine

## 2020-12-20 ENCOUNTER — Other Ambulatory Visit: Payer: Self-pay

## 2020-12-20 DIAGNOSIS — Z1231 Encounter for screening mammogram for malignant neoplasm of breast: Secondary | ICD-10-CM | POA: Insufficient documentation

## 2021-10-24 ENCOUNTER — Other Ambulatory Visit: Payer: Self-pay | Admitting: Internal Medicine

## 2021-10-24 DIAGNOSIS — Z1231 Encounter for screening mammogram for malignant neoplasm of breast: Secondary | ICD-10-CM

## 2021-12-23 ENCOUNTER — Ambulatory Visit
Admission: RE | Admit: 2021-12-23 | Discharge: 2021-12-23 | Disposition: A | Discharge status: Home / Self Care | Payer: Medicare Other | Source: Ambulatory Visit | Attending: Internal Medicine | Admitting: Internal Medicine

## 2021-12-23 DIAGNOSIS — Z1231 Encounter for screening mammogram for malignant neoplasm of breast: Secondary | ICD-10-CM | POA: Diagnosis present

## 2022-03-06 ENCOUNTER — Other Ambulatory Visit (HOSPITAL_BASED_OUTPATIENT_CLINIC_OR_DEPARTMENT_OTHER): Payer: Self-pay | Admitting: Gastroenterology

## 2022-03-06 DIAGNOSIS — R1084 Generalized abdominal pain: Secondary | ICD-10-CM

## 2022-03-06 DIAGNOSIS — R053 Chronic cough: Secondary | ICD-10-CM

## 2022-03-06 DIAGNOSIS — K2270 Barrett's esophagus without dysplasia: Secondary | ICD-10-CM

## 2022-03-06 DIAGNOSIS — R0789 Other chest pain: Secondary | ICD-10-CM

## 2022-03-10 ENCOUNTER — Other Ambulatory Visit: Payer: Self-pay | Admitting: Internal Medicine

## 2022-03-10 DIAGNOSIS — R1084 Generalized abdominal pain: Secondary | ICD-10-CM

## 2022-03-13 ENCOUNTER — Ambulatory Visit
Admission: RE | Admit: 2022-03-13 | Discharge: 2022-03-13 | Disposition: A | Discharge status: Home / Self Care | Payer: Medicare Other | Source: Ambulatory Visit | Attending: Internal Medicine | Admitting: Internal Medicine

## 2022-03-13 ENCOUNTER — Ambulatory Visit
Admission: RE | Admit: 2022-03-13 | Discharge: 2022-03-13 | Disposition: A | Discharge status: Home / Self Care | Payer: Medicare Other | Source: Ambulatory Visit | Attending: Gastroenterology | Admitting: Gastroenterology

## 2022-03-13 DIAGNOSIS — R0789 Other chest pain: Secondary | ICD-10-CM | POA: Insufficient documentation

## 2022-03-13 DIAGNOSIS — K2270 Barrett's esophagus without dysplasia: Secondary | ICD-10-CM | POA: Insufficient documentation

## 2022-03-13 DIAGNOSIS — R053 Chronic cough: Secondary | ICD-10-CM | POA: Insufficient documentation

## 2022-03-13 DIAGNOSIS — R1084 Generalized abdominal pain: Secondary | ICD-10-CM | POA: Insufficient documentation

## 2022-03-14 ENCOUNTER — Ambulatory Visit: Admission: RE | Admit: 2022-03-14 | Payer: Medicare Other | Source: Ambulatory Visit

## 2022-03-17 ENCOUNTER — Telehealth: Payer: Self-pay

## 2022-03-17 NOTE — Telephone Encounter (Signed)
Call placed to Julia Klein regarding referral from [facility] for abnormal CT findings. She reports she was admitted to the hospital over the weekend in [facility] and workup with paracentesis and biopsy has started. She is currently living with her fiance in [facility] and will be receiving her care there locally. We will close out the referral here and she was encouraged to call if anything changes. We wish her all the best.

## 2022-11-19 ENCOUNTER — Other Ambulatory Visit: Payer: Self-pay | Admitting: Internal Medicine

## 2022-11-19 DIAGNOSIS — Z1231 Encounter for screening mammogram for malignant neoplasm of breast: Secondary | ICD-10-CM

## 2022-12-31 ENCOUNTER — Ambulatory Visit
Admission: RE | Admit: 2022-12-31 | Discharge: 2022-12-31 | Disposition: A | Discharge status: Home / Self Care | Payer: Medicare Other | Source: Ambulatory Visit | Attending: Internal Medicine | Admitting: Internal Medicine

## 2022-12-31 DIAGNOSIS — Z1231 Encounter for screening mammogram for malignant neoplasm of breast: Secondary | ICD-10-CM | POA: Insufficient documentation

## 2023-10-13 ENCOUNTER — Other Ambulatory Visit: Payer: Self-pay | Admitting: Internal Medicine

## 2023-10-13 DIAGNOSIS — Z1231 Encounter for screening mammogram for malignant neoplasm of breast: Secondary | ICD-10-CM

## 2024-01-01 ENCOUNTER — Ambulatory Visit
Admission: RE | Admit: 2024-01-01 | Discharge: 2024-01-01 | Disposition: A | Discharge status: Home / Self Care | Source: Ambulatory Visit | Attending: Internal Medicine | Admitting: Internal Medicine

## 2024-01-01 DIAGNOSIS — Z1231 Encounter for screening mammogram for malignant neoplasm of breast: Secondary | ICD-10-CM | POA: Insufficient documentation

## 2024-02-18 ENCOUNTER — Other Ambulatory Visit: Payer: Self-pay | Admitting: Internal Medicine

## 2024-02-18 DIAGNOSIS — E7800 Pure hypercholesterolemia, unspecified: Secondary | ICD-10-CM

## 2024-02-18 DIAGNOSIS — C5700 Malignant neoplasm of unspecified fallopian tube: Secondary | ICD-10-CM

## 2024-02-18 DIAGNOSIS — G720 Drug-induced myopathy: Secondary | ICD-10-CM

## 2024-02-23 ENCOUNTER — Encounter: Payer: Self-pay | Admitting: Oncology

## 2024-02-29 ENCOUNTER — Ambulatory Visit
Admission: RE | Admit: 2024-02-29 | Discharge: 2024-02-29 | Disposition: A | Discharge status: Home / Self Care | Payer: Self-pay | Source: Ambulatory Visit | Attending: Internal Medicine | Admitting: Internal Medicine

## 2024-02-29 DIAGNOSIS — C5700 Malignant neoplasm of unspecified fallopian tube: Secondary | ICD-10-CM | POA: Insufficient documentation

## 2024-02-29 DIAGNOSIS — G720 Drug-induced myopathy: Secondary | ICD-10-CM | POA: Insufficient documentation

## 2024-02-29 DIAGNOSIS — T466X5A Adverse effect of antihyperlipidemic and antiarteriosclerotic drugs, initial encounter: Secondary | ICD-10-CM | POA: Insufficient documentation

## 2024-02-29 DIAGNOSIS — E7800 Pure hypercholesterolemia, unspecified: Secondary | ICD-10-CM | POA: Insufficient documentation
